# Patient Record
Sex: Female | Born: 1975 | Race: Black or African American | Hispanic: No | Marital: Married | Smoking: Never smoker
Health system: Southern US, Community
[De-identification: ages and names within clinical notes are randomized; demographics above are authoritative.]

## PROBLEM LIST (undated history)

## (undated) DIAGNOSIS — I10 Essential (primary) hypertension: Secondary | ICD-10-CM

---

## 1999-04-23 ENCOUNTER — Emergency Department (HOSPITAL_COMMUNITY): Admission: EM | Admit: 1999-04-23 | Discharge: 1999-04-23 | Payer: Self-pay | Admitting: Emergency Medicine

## 1999-04-23 ENCOUNTER — Encounter: Payer: Self-pay | Admitting: Emergency Medicine

## 2000-06-25 ENCOUNTER — Emergency Department (HOSPITAL_COMMUNITY): Admission: EM | Admit: 2000-06-25 | Discharge: 2000-06-26 | Payer: Self-pay | Admitting: Emergency Medicine

## 2000-06-26 ENCOUNTER — Encounter: Payer: Self-pay | Admitting: Emergency Medicine

## 2001-01-27 ENCOUNTER — Emergency Department (HOSPITAL_COMMUNITY): Admission: EM | Admit: 2001-01-27 | Discharge: 2001-01-27 | Payer: Self-pay | Admitting: Emergency Medicine

## 2001-02-08 ENCOUNTER — Emergency Department (HOSPITAL_COMMUNITY): Admission: EM | Admit: 2001-02-08 | Discharge: 2001-02-08 | Payer: Self-pay | Admitting: Emergency Medicine

## 2002-06-24 ENCOUNTER — Inpatient Hospital Stay (HOSPITAL_COMMUNITY): Admission: AD | Admit: 2002-06-24 | Discharge: 2002-06-24 | Payer: Self-pay | Admitting: Obstetrics and Gynecology

## 2002-09-05 ENCOUNTER — Emergency Department (HOSPITAL_COMMUNITY): Admission: EM | Admit: 2002-09-05 | Discharge: 2002-09-05 | Payer: Self-pay | Admitting: Emergency Medicine

## 2004-03-16 ENCOUNTER — Emergency Department (HOSPITAL_COMMUNITY): Admission: EM | Admit: 2004-03-16 | Discharge: 2004-03-16 | Payer: Self-pay | Admitting: Emergency Medicine

## 2004-03-20 ENCOUNTER — Inpatient Hospital Stay (HOSPITAL_COMMUNITY): Admission: AD | Admit: 2004-03-20 | Discharge: 2004-03-20 | Payer: Self-pay | Admitting: Obstetrics and Gynecology

## 2006-11-27 ENCOUNTER — Emergency Department (HOSPITAL_COMMUNITY): Admission: EM | Admit: 2006-11-27 | Discharge: 2006-11-27 | Payer: Self-pay | Admitting: Emergency Medicine

## 2007-06-02 ENCOUNTER — Emergency Department (HOSPITAL_COMMUNITY): Admission: EM | Admit: 2007-06-02 | Discharge: 2007-06-02 | Payer: Self-pay | Admitting: Emergency Medicine

## 2008-01-07 ENCOUNTER — Emergency Department (HOSPITAL_COMMUNITY): Admission: EM | Admit: 2008-01-07 | Discharge: 2008-01-07 | Payer: Self-pay | Admitting: Family Medicine

## 2008-08-31 ENCOUNTER — Emergency Department (HOSPITAL_COMMUNITY): Admission: EM | Admit: 2008-08-31 | Discharge: 2008-08-31 | Payer: Self-pay | Admitting: Emergency Medicine

## 2008-10-23 ENCOUNTER — Emergency Department (HOSPITAL_COMMUNITY): Admission: EM | Admit: 2008-10-23 | Discharge: 2008-10-24 | Payer: Self-pay | Admitting: Emergency Medicine

## 2008-11-24 ENCOUNTER — Emergency Department (HOSPITAL_COMMUNITY): Admission: EM | Admit: 2008-11-24 | Discharge: 2008-11-24 | Payer: Self-pay | Admitting: Family Medicine

## 2008-12-07 ENCOUNTER — Encounter: Admission: RE | Admit: 2008-12-07 | Discharge: 2008-12-07 | Payer: Self-pay | Admitting: Family Medicine

## 2009-02-28 ENCOUNTER — Emergency Department (HOSPITAL_COMMUNITY): Admission: EM | Admit: 2009-02-28 | Discharge: 2009-02-28 | Payer: Self-pay | Admitting: Emergency Medicine

## 2011-06-25 ENCOUNTER — Emergency Department (HOSPITAL_COMMUNITY)
Admission: EM | Admit: 2011-06-25 | Discharge: 2011-06-25 | Disposition: A | Discharge status: Home / Self Care | Payer: BC Managed Care – PPO | Attending: Emergency Medicine | Admitting: Emergency Medicine

## 2011-06-25 ENCOUNTER — Emergency Department (HOSPITAL_COMMUNITY): Payer: BC Managed Care – PPO

## 2011-06-25 DIAGNOSIS — R079 Chest pain, unspecified: Secondary | ICD-10-CM | POA: Insufficient documentation

## 2011-06-25 DIAGNOSIS — R0602 Shortness of breath: Secondary | ICD-10-CM | POA: Insufficient documentation

## 2011-06-25 DIAGNOSIS — R51 Headache: Secondary | ICD-10-CM | POA: Insufficient documentation

## 2011-06-25 DIAGNOSIS — R071 Chest pain on breathing: Secondary | ICD-10-CM | POA: Insufficient documentation

## 2011-06-25 DIAGNOSIS — H538 Other visual disturbances: Secondary | ICD-10-CM | POA: Insufficient documentation

## 2011-06-25 DIAGNOSIS — R209 Unspecified disturbances of skin sensation: Secondary | ICD-10-CM | POA: Insufficient documentation

## 2011-06-25 LAB — COMPREHENSIVE METABOLIC PANEL
ALT: 6 U/L (ref 0–35)
AST: 13 U/L (ref 0–37)
Calcium: 9.7 mg/dL (ref 8.4–10.5)
GFR calc Af Amer: >60 mL/min (ref >60)
Glucose, Bld: 96 mg/dL (ref 70–99)
Sodium: 139 mEq/L (ref 135–145)
Total Protein: 7.6 g/dL (ref 6.0–8.3)

## 2011-06-25 LAB — URINALYSIS, ROUTINE W REFLEX MICROSCOPIC
Leukocytes, UA: NEGATIVE
Specific Gravity, Urine: 1.026 (ref 1.005–1.030)
Urobilinogen, UA: 0.2 mg/dL (ref 0.0–1.0)

## 2011-06-25 LAB — URINE MICROSCOPIC-ADD ON

## 2011-06-25 LAB — CBC
MCHC: 34.4 g/dL (ref 30.0–36.0)
RDW: 12.6 % (ref 11.5–15.5)

## 2011-06-25 LAB — POCT PREGNANCY, URINE: Preg Test, Ur: NEGATIVE

## 2011-06-25 LAB — DIFFERENTIAL
Basophils Absolute: 0 10*3/uL (ref 0.0–0.1)
Basophils Relative: 0 % (ref 0–1)
Eosinophils Absolute: 0.2 10*3/uL (ref 0.0–0.7)
Eosinophils Relative: 3 % (ref 0–5)
Monocytes Absolute: 0.6 10*3/uL (ref 0.1–1.0)

## 2011-07-24 LAB — POCT CARDIAC MARKERS: Myoglobin, poc: 30.5

## 2011-07-24 LAB — POCT I-STAT, CHEM 8
Calcium, Ion: 1.16
Chloride: 108
HCT: 40
Hemoglobin: 13.6
TCO2: 25

## 2012-05-04 ENCOUNTER — Encounter (HOSPITAL_COMMUNITY): Payer: Self-pay | Admitting: *Deleted

## 2012-05-04 ENCOUNTER — Emergency Department (HOSPITAL_COMMUNITY)
Admission: EM | Admit: 2012-05-04 | Discharge: 2012-05-05 | Disposition: A | Discharge status: Home / Self Care | Payer: BC Managed Care – PPO | Attending: Emergency Medicine | Admitting: Emergency Medicine

## 2012-05-04 DIAGNOSIS — M79606 Pain in leg, unspecified: Secondary | ICD-10-CM

## 2012-05-04 DIAGNOSIS — M79609 Pain in unspecified limb: Secondary | ICD-10-CM | POA: Insufficient documentation

## 2012-05-04 NOTE — ED Notes (Signed)
Pt c/o left leg pain x1.5 weeks. Pt states pain was originally in calf but is now at inner thigh. Pt describes pain as sharp and constat but worse with movement/weight bearing.

## 2012-05-04 NOTE — ED Provider Notes (Signed)
History     CSN: [id number]  Arrival date & time 05/04/12  2305   First MD Initiated Contact with Patient 05/04/12 2332      Chief Complaint  Patient presents with  . Leg Pain    (Consider location/radiation/quality/duration/timing/severity/associated sxs/prior treatment) HPI Comments: Pt with history of approximately 10 days of left lower extremity pain on the medial calf just below the knee. It is intermittent, worse at night, not associated with shortness of breath. She noted that after getting home from church today the pain had migrated proximally and is now above the knee on the medial thigh. She feels like she can palpate tenderness in that area. She denies redness swelling fevers chills nausea or vomiting. She does feel as though she has been dehydrated and notes that her urine has been dark. There has been no dysuria, diarrhea, urinary frequenc. She has no risk factors for venous thromboembolism, has not had shortness of breath, travel, trauma, immobilization, history of cancer, hormone therapy use. There are no family members or personal history of VTE  Patient is a 36 y.o. female presenting with leg pain. The history is provided by the patient.  Leg Pain     History reviewed. No pertinent past medical history.  History reviewed. No pertinent past surgical history.  History reviewed. No pertinent family history.  History  Substance Use Topics  . Smoking status: Never Smoker   . Smokeless tobacco: Not on file  . Alcohol Use: No    OB History    Grav Para Term Preterm Abortions TAB SAB Ect Mult Living                  Review of Systems  All other systems reviewed and are negative.    Allergies  Penicillins  Home Medications   Current Outpatient Rx  Name Route Sig Dispense Refill  . NAPROXEN 500 MG PO TABS Oral Take 1 tablet (500 mg total) by mouth 2 (two) times daily with a meal. 30 tablet 0    BP 144/89  Pulse 92  Temp 97.5 F (36.4 C) (Oral)  Resp  20  SpO2 97%  LMP 01/24/2012  Physical Exam  Nursing note and vitals reviewed. Constitutional: She appears well-developed and well-nourished. No distress.  HENT:  Head: Normocephalic and atraumatic.  Mouth/Throat: Oropharynx is clear and moist. No oropharyngeal exudate.  Eyes: Conjunctivae and EOM are normal. Pupils are equal, round, and reactive to light. Right eye exhibits no discharge. Left eye exhibits no discharge. No scleral icterus.  Neck: Normal range of motion. Neck supple. No JVD present. No thyromegaly present.  Cardiovascular: Normal rate, regular rhythm, normal heart sounds and intact distal pulses.  Exam reveals no gallop and no friction rub.   No murmur heard.      Normal capillary refill and pulses at the feet bilaterally  Pulmonary/Chest: Effort normal and breath sounds normal. No respiratory distress. She has no wheezes. She has no rales.  Abdominal: Soft. Bowel sounds are normal. She exhibits no distension and no mass. There is no tenderness.  Musculoskeletal: Normal range of motion. She exhibits tenderness ( Left lower extremity with mild tenderness just proximal to the knee on the medial thigh. No masses palpated, negative Homans sign). She exhibits no edema.       No tenderness over the left lower extremity below the knee, no asymmetry, no edema  Lymphadenopathy:    She has no cervical adenopathy.  Neurological: She is alert. Coordination normal.  Skin: Skin  is warm and dry. No rash noted. No erythema.  Psychiatric: She has a normal mood and affect. Her behavior is normal.    ED Course  Procedures (including critical care time)  Labs Reviewed  BASIC METABOLIC PANEL - Abnormal; Notable for the following:    Glucose, Bld 115 (*)     GFR calc non Af Amer 73 (*)     GFR calc Af Amer 85 (*)     All other components within normal limits  URINALYSIS, ROUTINE W REFLEX MICROSCOPIC - Abnormal; Notable for the following:    Hgb urine dipstick TRACE (*)     All other  components within normal limits  URINE MICROSCOPIC-ADD ON - Abnormal; Notable for the following:    Squamous Epithelial / LPF FEW (*)     All other components within normal limits  D-DIMER, QUANTITATIVE  PREGNANCY, URINE   No results found.   1. Lower extremity pain       MDM   well appearing, vital signs normal, no tachycardia, fever, hypoxia or asymmetry of the legs. She is extremely low risk for DVT, d-dimer ordered, check potassium for hypokalemia, urinalysis as it has been dark. She is not nauseated and does not want an IV, will give oral fluids.  Laboratory work reviewed, urinalysis shows no significant dehydration, no ketones though she does have a high specific gravity. She is taking is without difficulty. Renal function is normal, potassium is normal and a d-dimer is negative. Findings discussed with the patient, home with Naprosyn.      Vida Roller, MD 05/05/12 [phone]

## 2012-05-04 NOTE — ED Notes (Signed)
Pt c/o medial calf pain x 2 weeks, tonight pt states pain in L medial thigh. Pt has no hx of DVT and few risk factors. Pt states worsening pain w/ ambulation. Pt denies injury.

## 2012-05-05 LAB — URINALYSIS, ROUTINE W REFLEX MICROSCOPIC
Bilirubin Urine: NEGATIVE
Glucose, UA: NEGATIVE mg/dL
Ketones, ur: NEGATIVE mg/dL
Protein, ur: NEGATIVE mg/dL
pH: 5.5 (ref 5.0–8.0)

## 2012-05-05 LAB — URINE MICROSCOPIC-ADD ON

## 2012-05-05 LAB — BASIC METABOLIC PANEL
BUN: 11 mg/dL (ref 6–23)
CO2: 26 mEq/L (ref 19–32)
Calcium: 9.5 mg/dL (ref 8.4–10.5)
Creatinine, Ser: 0.98 mg/dL (ref 0.50–1.10)
GFR calc non Af Amer: 73 mL/min — ABNORMAL LOW (ref >90)
Glucose, Bld: 115 mg/dL — ABNORMAL HIGH (ref 70–99)
Sodium: 137 mEq/L (ref 135–145)

## 2012-05-05 MED ORDER — NAPROXEN 500 MG PO TABS: 500.0000 mg | ORAL_TABLET | Freq: Two times a day (BID) | ORAL | Status: AC

## 2016-06-12 DIAGNOSIS — E282 Polycystic ovarian syndrome: Secondary | ICD-10-CM | POA: Insufficient documentation

## 2017-05-30 ENCOUNTER — Encounter (INDEPENDENT_AMBULATORY_CARE_PROVIDER_SITE_OTHER): Payer: Self-pay | Admitting: Ophthalmology

## 2018-07-27 ENCOUNTER — Other Ambulatory Visit: Payer: Self-pay

## 2018-07-27 ENCOUNTER — Emergency Department (HOSPITAL_COMMUNITY)
Admission: EM | Admit: 2018-07-27 | Discharge: 2018-07-28 | Disposition: A | Discharge status: Home / Self Care | Payer: Self-pay | Attending: Emergency Medicine | Admitting: Emergency Medicine

## 2018-07-27 ENCOUNTER — Emergency Department (HOSPITAL_COMMUNITY): Payer: Self-pay

## 2018-07-27 ENCOUNTER — Encounter (HOSPITAL_COMMUNITY): Payer: Self-pay | Admitting: Obstetrics and Gynecology

## 2018-07-27 DIAGNOSIS — I10 Essential (primary) hypertension: Secondary | ICD-10-CM | POA: Insufficient documentation

## 2018-07-27 DIAGNOSIS — R1011 Right upper quadrant pain: Secondary | ICD-10-CM | POA: Insufficient documentation

## 2018-07-27 HISTORY — DX: Essential (primary) hypertension: I10

## 2018-07-27 LAB — COMPREHENSIVE METABOLIC PANEL
ALBUMIN: 4 g/dL (ref 3.5–5.0)
ALT: 7 U/L (ref 0–44)
AST: 14 U/L — AB (ref 15–41)
Alkaline Phosphatase: 79 U/L (ref 38–126)
Anion gap: 11 (ref 5–15)
BUN: 19 mg/dL (ref 6–20)
CHLORIDE: 103 mmol/L (ref 98–111)
CO2: 27 mmol/L (ref 22–32)
Calcium: 9.8 mg/dL (ref 8.9–10.3)
Creatinine, Ser: 0.87 mg/dL (ref 0.44–1.00)
GFR calc Af Amer: >60 mL/min (ref >60)
GFR calc non Af Amer: >60 mL/min (ref >60)
Glucose, Bld: 106 mg/dL — ABNORMAL HIGH (ref 70–99)
POTASSIUM: 3.7 mmol/L (ref 3.5–5.1)
SODIUM: 141 mmol/L (ref 135–145)
Total Bilirubin: 0.5 mg/dL (ref 0.3–1.2)
Total Protein: 7.6 g/dL (ref 6.5–8.1)

## 2018-07-27 LAB — CBC
HCT: 41.7 % (ref 36.0–46.0)
Hemoglobin: 14.3 g/dL (ref 12.0–15.0)
MCH: 32.3 pg (ref 26.0–34.0)
MCHC: 34.3 g/dL (ref 30.0–36.0)
MCV: 94.1 fL (ref 78.0–100.0)
PLATELETS: 245 10*3/uL (ref 150–400)
RBC: 4.43 MIL/uL (ref 3.87–5.11)
RDW: 12.2 % (ref 11.5–15.5)
WBC: 8.3 10*3/uL (ref 4.0–10.5)

## 2018-07-27 LAB — I-STAT BETA HCG BLOOD, ED (MC, WL, AP ONLY): I-stat hCG, quantitative: <5 m[IU]/mL (ref <5)

## 2018-07-27 LAB — LIPASE, BLOOD: Lipase: 40 U/L (ref 11–51)

## 2018-07-27 MED ORDER — OXYCODONE HCL 5 MG PO TABS
5.0000 mg | ORAL_TABLET | Freq: Once | ORAL | Status: AC
  Administered 2018-07-28: 5 mg via ORAL
  Filled 2018-07-27: qty 1

## 2018-07-27 MED ORDER — OXYCODONE-ACETAMINOPHEN 5-325 MG PO TABS: 2.0000 | ORAL_TABLET | ORAL | 0 refills | Status: AC | PRN

## 2018-07-27 NOTE — ED Provider Notes (Addendum)
[facility] Emergency Department Provider Note MRN:  [id number]  Arrival date & time: 07/27/18     Chief Complaint   Abdominal Pain   History of Present Illness   Christina Delacruz is a 42 y.o. year-old female with a history of HTN presenting to the ED with chief complaint of abdominal pain.  2 months of intermittent RUQ pain.  Dull, moderate in severity.  Worse today.  Nausea but no vomiting.  No lower abdominal pain.  No vaginal bleeding or discharge.  No chest pain or shortness of breath.  No history of GERD, no relation to meals.  Review of Systems  A complete 10 system review of systems was obtained and all systems are negative except as noted in the HPI and PMH.   Patient's Health History    Past Medical History:  Diagnosis Date  . Hypertension     History reviewed. No pertinent surgical history.  No family history on file.  Social History   Socioeconomic History  . Marital status: Single    Spouse name: Not on file  . Number of children: Not on file  . Years of education: Not on file  . Highest education level: Not on file  Occupational History  . Not on file  Social Needs  . Financial resource strain: Not on file  . Food insecurity:    Worry: Not on file    Inability: Not on file  . Transportation needs:    Medical: Not on file    Non-medical: Not on file  Tobacco Use  . Smoking status: Never Smoker  . Smokeless tobacco: Never Used  Substance and Sexual Activity  . Alcohol use: No  . Drug use: No  . Sexual activity: Yes    Birth control/protection: None  Lifestyle  . Physical activity:    Days per week: Not on file    Minutes per session: Not on file  . Stress: Not on file  Relationships  . Social connections:    Talks on phone: Not on file    Gets together: Not on file    Attends religious service: Not on file    Active member of club or organization: Not on file    Attends meetings of clubs or organizations: Not on file   Relationship status: Not on file  . Intimate partner violence:    Fear of current or ex partner: Not on file    Emotionally abused: Not on file    Physically abused: Not on file    Forced sexual activity: Not on file  Other Topics Concern  . Not on file  Social History Narrative  . Not on file     Physical Exam  Vital Signs and Nursing Notes reviewed Vitals:   07/27/18 2130  BP: (!) 146/97  Pulse: 92  Resp: 18  Temp: 98.5 F (36.9 C)  SpO2: 100%    CONSTITUTIONAL: Well-appearing, NAD NEURO:  Alert and oriented x 3, no focal deficits EYES:  eyes equal and reactive ENT/NECK:  no LAD, no JVD CARDIO: Regular rate, well-perfused, normal S1 and S2 PULM:  CTAB no wheezing or rhonchi GI/GU:  normal bowel sounds, non-distended, mild to moderate tenderness palpation to the right upper quadrant MSK/SPINE:  No gross deformities, no edema SKIN:  no rash, atraumatic PSYCH:  Appropriate speech and behavior  Diagnostic and Interventional Summary    EKG Interpretation  Date/Time:    Ventricular Rate:    PR Interval:    QRS Duration:  QT Interval:    QTC Calculation:   R Axis:     Text Interpretation:        Labs Reviewed  COMPREHENSIVE METABOLIC PANEL - Abnormal; Notable for the following components:      Result Value   Glucose, Bld 106 (*)    AST 14 (*)    All other components within normal limits  CBC  LIPASE, BLOOD  I-STAT BETA HCG BLOOD, ED ([facility], [facility], [facility] ONLY)    US Abdomen Limited RUQ  Final Result      Medications  oxyCODONE (Oxy IR/ROXICODONE) immediate release tablet 5 mg (0 mg Oral Hold 07/27/18 2225)     Procedures Critical Care  ED Course and Medical Decision Making  I have reviewed the triage vital signs and the nursing notes.  Pertinent labs & imaging results that were available during my care of the patient were reviewed by me and considered in my medical decision making (see below for details).  Favoring biliary colic in this 42 year old female  with intermittent right upper quadrant pain.  Ultrasound pending.  Anticipating biliary colic, discharge with general surgery referral.  Ultrasound reveals no cholecystitis, no stones, mild ectasia of the common bile duct.  Patient feeling better, continues to be well-appearing with stable vital signs.  Labs are very reassuring, normal LFTs, normal T bili.  Nothing to suggest an emergent condition currently such as ascending cholangitis.  Patient agreeable to outpatient management, given number to [facility], will call in the morning.  Stressed the importance of returning to the ED with fever or worsening of symptoms.    After the discussed management above, the patient was determined to be safe for discharge.  The patient was in agreement with this plan and all questions regarding their care were answered.  ED return precautions were discussed and the patient will return to the ED with any significant worsening of condition.  Elmer Sow. Pilar Plate, MD [facility] Emergency Medicine [facility] [email or web address]  Final Clinical Impressions(s) / ED Diagnoses     ICD-10-CM   1. RUQ abdominal pain R10.11 US Abdomen Limited RUQ    US Abdomen Limited RUQ    ED Discharge Orders         Ordered    oxyCODONE-acetaminophen (PERCOCET/ROXICET) 5-325 MG tablet  Every 4 hours PRN     07/27/18 2350            Sabas Sous, MD 07/27/18 2330    Sabas Sous, MD 07/27/18 2355

## 2018-07-27 NOTE — Discharge Instructions (Signed)
You were evaluated in the Emergency Department and after careful evaluation, we did not find any emergent condition requiring admission or further testing in the hospital.  Your symptoms today may be related to dilation of your common bile duct.  Your labs today were very reassuring.  Please call the gastroenterology office tomorrow morning to schedule appointment.  Please return to the Emergency Department if you experience any worsening of your condition or fever.  We encourage you to follow up with a primary care provider.  Thank you for allowing [place] to be a part of your care.

## 2018-07-27 NOTE — ED Triage Notes (Signed)
Pt reports pain in her RUQ. Pt reports intermittent nausea, but no emesis. Pt denies any bowel abnormalities. Pt reports last BM today that was harder than normal. Pt reports her appetite recently changed.

## 2018-12-16 ENCOUNTER — Other Ambulatory Visit (HOSPITAL_COMMUNITY): Payer: Self-pay | Admitting: Gastroenterology

## 2018-12-16 ENCOUNTER — Other Ambulatory Visit: Payer: Self-pay | Admitting: Gastroenterology

## 2018-12-16 DIAGNOSIS — R935 Abnormal findings on diagnostic imaging of other abdominal regions, including retroperitoneum: Secondary | ICD-10-CM

## 2018-12-16 DIAGNOSIS — R1011 Right upper quadrant pain: Secondary | ICD-10-CM

## 2018-12-29 ENCOUNTER — Ambulatory Visit (HOSPITAL_COMMUNITY)
Admission: RE | Admit: 2018-12-29 | Discharge: 2018-12-29 | Disposition: A | Discharge status: Home / Self Care | Payer: BC Managed Care – PPO | Source: Ambulatory Visit | Attending: Gastroenterology | Admitting: Gastroenterology

## 2018-12-29 DIAGNOSIS — R935 Abnormal findings on diagnostic imaging of other abdominal regions, including retroperitoneum: Secondary | ICD-10-CM | POA: Diagnosis present

## 2018-12-29 DIAGNOSIS — R1011 Right upper quadrant pain: Secondary | ICD-10-CM | POA: Insufficient documentation

## 2019-03-31 ENCOUNTER — Other Ambulatory Visit: Payer: Self-pay | Admitting: *Deleted

## 2019-03-31 DIAGNOSIS — Z20822 Contact with and (suspected) exposure to covid-19: Secondary | ICD-10-CM

## 2019-04-03 LAB — NOVEL CORONAVIRUS, NAA: SARS-CoV-2, NAA: NOT DETECTED

## 2019-05-19 ENCOUNTER — Other Ambulatory Visit: Payer: Self-pay | Admitting: *Deleted

## 2019-05-19 DIAGNOSIS — Z20822 Contact with and (suspected) exposure to covid-19: Secondary | ICD-10-CM

## 2019-05-19 NOTE — Progress Notes (Unsigned)
la 

## 2019-05-20 ENCOUNTER — Other Ambulatory Visit: Payer: Self-pay

## 2019-05-20 DIAGNOSIS — Z20822 Contact with and (suspected) exposure to covid-19: Secondary | ICD-10-CM

## 2019-05-21 LAB — NOVEL CORONAVIRUS, NAA: SARS-CoV-2, NAA: NOT DETECTED

## 2019-06-03 ENCOUNTER — Other Ambulatory Visit: Payer: Self-pay

## 2019-06-03 DIAGNOSIS — Z20822 Contact with and (suspected) exposure to covid-19: Secondary | ICD-10-CM

## 2019-06-04 LAB — NOVEL CORONAVIRUS, NAA: SARS-CoV-2, NAA: NOT DETECTED

## 2019-06-09 ENCOUNTER — Telehealth: Payer: Self-pay

## 2019-06-09 NOTE — Telephone Encounter (Signed)
Pt. Called back, given COVID 19 results. 

## 2019-08-15 ENCOUNTER — Other Ambulatory Visit: Payer: Self-pay

## 2019-08-15 DIAGNOSIS — Z20822 Contact with and (suspected) exposure to covid-19: Secondary | ICD-10-CM

## 2019-08-16 LAB — NOVEL CORONAVIRUS, NAA: SARS-CoV-2, NAA: NOT DETECTED

## 2019-09-12 ENCOUNTER — Other Ambulatory Visit: Payer: Self-pay

## 2019-09-12 DIAGNOSIS — Z20822 Contact with and (suspected) exposure to covid-19: Secondary | ICD-10-CM

## 2019-09-14 LAB — NOVEL CORONAVIRUS, NAA: SARS-CoV-2, NAA: NOT DETECTED

## 2019-10-03 ENCOUNTER — Other Ambulatory Visit: Payer: Self-pay

## 2019-10-03 DIAGNOSIS — Z20822 Contact with and (suspected) exposure to covid-19: Secondary | ICD-10-CM

## 2019-10-04 LAB — NOVEL CORONAVIRUS, NAA: SARS-CoV-2, NAA: NOT DETECTED

## 2019-10-15 ENCOUNTER — Encounter (HOSPITAL_COMMUNITY): Payer: Self-pay

## 2019-10-15 ENCOUNTER — Other Ambulatory Visit: Payer: Self-pay

## 2019-10-15 DIAGNOSIS — R079 Chest pain, unspecified: Secondary | ICD-10-CM | POA: Insufficient documentation

## 2019-10-15 DIAGNOSIS — I10 Essential (primary) hypertension: Secondary | ICD-10-CM | POA: Insufficient documentation

## 2019-10-15 DIAGNOSIS — R0600 Dyspnea, unspecified: Secondary | ICD-10-CM | POA: Insufficient documentation

## 2019-10-15 MED ORDER — SODIUM CHLORIDE 0.9% FLUSH: 3.0000 mL | Freq: Once | INTRAVENOUS | Status: DC

## 2019-10-15 NOTE — ED Triage Notes (Signed)
Pt coming from home c/o chest pain x1 week that comes and goes. Endorses N, dizziness and a headache. 7/10. Feels tight. Hx of HTN but takes medication. Worse at night

## 2019-10-16 ENCOUNTER — Emergency Department (HOSPITAL_COMMUNITY)
Admission: EM | Admit: 2019-10-16 | Discharge: 2019-10-16 | Disposition: A | Discharge status: Home / Self Care | Payer: Self-pay | Attending: Emergency Medicine | Admitting: Emergency Medicine

## 2019-10-16 ENCOUNTER — Emergency Department (HOSPITAL_COMMUNITY): Payer: Self-pay

## 2019-10-16 DIAGNOSIS — R0600 Dyspnea, unspecified: Secondary | ICD-10-CM

## 2019-10-16 DIAGNOSIS — R030 Elevated blood-pressure reading, without diagnosis of hypertension: Secondary | ICD-10-CM

## 2019-10-16 DIAGNOSIS — R079 Chest pain, unspecified: Secondary | ICD-10-CM

## 2019-10-16 LAB — CBC
HCT: 40.6 % (ref 36.0–46.0)
Hemoglobin: 13.3 g/dL (ref 12.0–15.0)
MCH: 31.9 pg (ref 26.0–34.0)
MCHC: 32.8 g/dL (ref 30.0–36.0)
MCV: 97.4 fL (ref 80.0–100.0)
Platelets: 257 10*3/uL (ref 150–400)
RBC: 4.17 MIL/uL (ref 3.87–5.11)
RDW: 11.8 % (ref 11.5–15.5)
WBC: 6 10*3/uL (ref 4.0–10.5)
nRBC: 0 % (ref 0.0–0.2)

## 2019-10-16 LAB — BASIC METABOLIC PANEL
Anion gap: 9 (ref 5–15)
BUN: 11 mg/dL (ref 6–20)
CO2: 26 mmol/L (ref 22–32)
Calcium: 8.7 mg/dL — ABNORMAL LOW (ref 8.9–10.3)
Chloride: 102 mmol/L (ref 98–111)
Creatinine, Ser: 1.03 mg/dL — ABNORMAL HIGH (ref 0.44–1.00)
GFR calc Af Amer: >60 mL/min (ref >60)
GFR calc non Af Amer: >60 mL/min (ref >60)
Glucose, Bld: 94 mg/dL (ref 70–99)
Potassium: 3.8 mmol/L (ref 3.5–5.1)
Sodium: 137 mmol/L (ref 135–145)

## 2019-10-16 LAB — I-STAT BETA HCG BLOOD, ED (NOT ORDERABLE): I-stat hCG, quantitative: <5 m[IU]/mL (ref <5)

## 2019-10-16 LAB — BRAIN NATRIURETIC PEPTIDE: B Natriuretic Peptide: 9.7 pg/mL (ref 0.0–100.0)

## 2019-10-16 LAB — TROPONIN I (HIGH SENSITIVITY): Troponin I (High Sensitivity): <2 ng/L (ref <18)

## 2019-10-16 NOTE — ED Provider Notes (Signed)
[facility] DEPT Provider Note   CSN: [id number] Arrival date & time: 10/15/19  2338     History Chief Complaint  Patient presents with  . Chest Pain    Christina Delacruz is a 43 y.o. female.  HPI  HPI: A 43 year old patient with a history of hypertension and obesity presents for evaluation of chest pain. Initial onset of pain was approximately 3-6 hours ago. The patient's chest pain is described as heaviness/pressure/tightness and is worse with exertion. The patient complains of nausea. The patient's chest pain is middle- or left-sided, is not well-localized, is not sharp and does not radiate to the arms/jaw/neck. The patient denies diaphoresis. The patient has no history of stroke, has no history of peripheral artery disease, has not smoked in the past 90 days, denies any history of treated diabetes, has no relevant family history of coronary artery disease (first degree relative at less than age 23) and has no history of hypercholesterolemia.   Patient reports that over the past few days she has been having shortness of breath.  Shortness of breath typically comes about at nighttime when she is asleep.  She intermittently has associated chest pain.  When patient has chest pain it is midsternal and nonradiating.  There is no history of GERD.  Pt has no hx of PE, DVT and denies any exogenous hormone (testosterone / estrogen) use, long distance travels or surgery in the past 6 weeks, active cancer, recent immobilization.   Past Medical History:  Diagnosis Date  . Hypertension     There are no problems to display for this patient.   History reviewed. No pertinent surgical history.   OB History    Gravida      Para      Term      Preterm      AB      Living  0     SAB      TAB      Ectopic      Multiple      Live Births              No family history on file.  Social History   Tobacco Use  . Smoking status: Never Smoker    . Smokeless tobacco: Never Used  Substance Use Topics  . Alcohol use: No  . Drug use: No    Home Medications Prior to Admission medications   Medication Sig Start Date End Date Taking? Authorizing Provider  oxyCODONE-acetaminophen (PERCOCET/ROXICET) 5-325 MG tablet Take 2 tablets by mouth every 4 (four) hours as needed for severe pain. 07/27/18   Maudie Flakes, MD    Allergies    Penicillins  Review of Systems   Review of Systems  Constitutional: Positive for activity change.  Respiratory: Positive for chest tightness and shortness of breath.   Cardiovascular: Positive for chest pain.  Gastrointestinal: Negative for nausea and vomiting.  Neurological: Positive for dizziness. Negative for syncope.  All other systems reviewed and are negative.   Physical Exam Updated Vital Signs BP (!) 141/96   Pulse 84   Temp 98.1 F (36.7 C) (Oral)   Resp 15   Ht 5\' 5"  (1.651 m)   Wt 104.3 kg   LMP 10/09/2019   SpO2 99%   BMI 38.27 kg/m   Physical Exam Vitals and nursing note reviewed.  Constitutional:      Appearance: She is well-developed.  HENT:     Head: Normocephalic and atraumatic.  Eyes:  Pupils: Pupils are equal, round, and reactive to light.  Cardiovascular:     Rate and Rhythm: Normal rate and regular rhythm.     Heart sounds: Normal heart sounds. No murmur.  Pulmonary:     Effort: Pulmonary effort is normal. No respiratory distress.  Abdominal:     General: There is no distension.     Palpations: Abdomen is soft.     Tenderness: There is no abdominal tenderness. There is no guarding or rebound.  Musculoskeletal:     Cervical back: Neck supple.     Right lower leg: No edema.     Left lower leg: No edema.  Skin:    General: Skin is warm and dry.  Neurological:     Mental Status: She is alert and oriented to person, place, and time.     ED Results / Procedures / Treatments   Labs (all labs ordered are listed, but only abnormal results are  displayed) Labs Reviewed  BASIC METABOLIC PANEL - Abnormal; Notable for the following components:      Result Value   Creatinine, Ser 1.03 (*)    Calcium 8.7 (*)    All other components within normal limits  CBC  BRAIN NATRIURETIC PEPTIDE  I-STAT BETA HCG BLOOD, ED ([facility], [facility], [facility] ONLY)  I-STAT BETA HCG BLOOD, ED (NOT ORDERABLE)  TROPONIN I (HIGH SENSITIVITY)  TROPONIN I (HIGH SENSITIVITY)    EKG EKG Interpretation  Date/Time:  Thursday October 15 2019 23:53:44 EST Ventricular Rate:  94 PR Interval:    QRS Duration: 87 QT Interval:  367 QTC Calculation: 459 R Axis:   70 Text Interpretation: Sinus rhythm No acute changes No significant change since last tracing Confirmed by Derwood Kaplan [phone]) on 10/16/2019 12:55:06 AM   Radiology DG Chest 2 View  Result Date: 10/16/2019 CLINICAL DATA:  Chest pain, nausea, dizziness and headache EXAM: CHEST - 2 VIEW COMPARISON:  Radiograph 07/15/2011 FINDINGS: No consolidation, features of edema, pneumothorax, or effusion. Pulmonary vascularity is normally distributed. The cardiomediastinal contours are unremarkable. No acute osseous or soft tissue abnormality. IMPRESSION: No acute cardiopulmonary abnormality. Electronically Signed   By: Kreg Shropshire M.D.   On: 10/16/2019 00:33    Procedures Procedures (including critical care time)  Medications Ordered in ED Medications  sodium chloride flush (NS) 0.9 % injection 3 mL (has no administration in time range)    ED Course  I have reviewed the triage vital signs and the nursing notes.  Pertinent labs & imaging results that were available during my care of the patient were reviewed by me and considered in my medical decision making (see chart for details).    MDM Rules/Calculators/A&P HEAR Score: 3                    Differential diagnosis includes: ACS syndrome Aortic dissection Myocarditis Pericarditis Pericardial effusion / tamponade Pneumonia PE Musculoskeletal pain PUD /  Gastritis / Esophagitis Esophageal spasm  43 year old female with history of hypertension comes in with nonspecific chest pain or shortness of breath.  The 2 are present together sometimes, at other times they are present separately.  Symptoms are worse with exertion and with her laying flat.  CHF is high on the differential although pulmonary exam and lower extremity exam is benign.  BNP is ordered.  ACS is low on the differential diagnosis.  High-sensitivity troponin is negative her hear score is less than 4 and her last episode of chest pain was more than 3 hours ago,  therefore we do not need a repeat troponin.  Chest x-ray is negative.  EKG is reassuring and patient has 0 on Wells score and is PERC negative.  We do not think she has PE but have discussed return precautions for it.  Also she complains of elevated blood pressure.  We have advised her to keep a log of her BP and follow-up with her PCP in regards to it.  Final Clinical Impression(s) / ED Diagnoses Final diagnoses:  Nonspecific chest pain  Dyspnea, unspecified type  Elevated blood pressure reading    Rx / DC Orders ED Discharge Orders    None       Derwood KaplanNanavati, Josephmichael Lisenbee, MD 10/16/19 0300

## 2019-10-16 NOTE — Discharge Instructions (Addendum)
We saw you in the ER for the chest pain/shortness of breath. All of our cardiac workup is normal, including labs, EKG and chest X-RAY are normal. There is no evidence of heart attack, heart failure or infection in your lung.  We are not sure what is causing your discomfort, but we feel comfortable sending you home at this time. The workup in the ER is not complete, and you should follow up with your primary care doctor for further evaluation.  Please return to the ER if you have worsening chest pain, shortness of breath, pain radiating to your jaw, shoulder, or back, sweats or fainting. Otherwise see the Cardiologist or your primary care doctor as requested.

## 2019-11-05 ENCOUNTER — Other Ambulatory Visit: Payer: Self-pay | Admitting: Cardiology

## 2019-11-05 DIAGNOSIS — Z20822 Contact with and (suspected) exposure to covid-19: Secondary | ICD-10-CM

## 2019-11-06 LAB — NOVEL CORONAVIRUS, NAA: SARS-CoV-2, NAA: NOT DETECTED

## 2019-11-26 ENCOUNTER — Other Ambulatory Visit: Payer: Self-pay

## 2019-11-26 DIAGNOSIS — Z20822 Contact with and (suspected) exposure to covid-19: Secondary | ICD-10-CM

## 2019-11-27 LAB — NOVEL CORONAVIRUS, NAA: SARS-CoV-2, NAA: NOT DETECTED

## 2019-12-19 ENCOUNTER — Other Ambulatory Visit: Payer: Self-pay

## 2019-12-19 DIAGNOSIS — Z20822 Contact with and (suspected) exposure to covid-19: Secondary | ICD-10-CM

## 2019-12-21 LAB — NOVEL CORONAVIRUS, NAA: SARS-CoV-2, NAA: NOT DETECTED

## 2020-10-10 DIAGNOSIS — M1711 Unilateral primary osteoarthritis, right knee: Secondary | ICD-10-CM | POA: Insufficient documentation

## 2020-10-19 ENCOUNTER — Other Ambulatory Visit (HOSPITAL_COMMUNITY): Payer: Self-pay | Admitting: Gastroenterology

## 2020-10-19 ENCOUNTER — Other Ambulatory Visit: Payer: Self-pay | Admitting: Gastroenterology

## 2020-10-19 DIAGNOSIS — K862 Cyst of pancreas: Secondary | ICD-10-CM

## 2020-11-04 ENCOUNTER — Ambulatory Visit (HOSPITAL_COMMUNITY)
Admission: RE | Admit: 2020-11-04 | Discharge: 2020-11-04 | Disposition: A | Discharge status: Home / Self Care | Payer: 59 | Source: Ambulatory Visit | Attending: Gastroenterology | Admitting: Gastroenterology

## 2020-11-04 ENCOUNTER — Other Ambulatory Visit (HOSPITAL_COMMUNITY): Payer: Self-pay | Admitting: Gastroenterology

## 2020-11-04 ENCOUNTER — Other Ambulatory Visit: Payer: Self-pay

## 2020-11-04 DIAGNOSIS — K862 Cyst of pancreas: Secondary | ICD-10-CM | POA: Insufficient documentation

## 2021-01-15 IMAGING — MR MR MRCP
10 of 12 series · 38 of 48 positions shown · non-contrast
Comparison: Abdominal MRI 12/29/2018. Abdominal ultrasound
07/28/2018.

CLINICAL DATA: Follow-up pancreatic cyst.  No current complaints.

EXAM:
MRI ABDOMEN WITHOUT CONTRAST  (INCLUDING MRCP)
TECHNIQUE: Multiplanar multisequence MR imaging of the abdomen was performed.
Heavily T2-weighted images of the biliary and pancreatic ducts were
obtained, and three-dimensional MRCP images were rendered by post
processing.

[Series 3: T2 fat-sat · axial · 6.0mm · 1.09mm/px · z∈[-78,+174]mm · 3 of 36 slices shown]
[im 1/36]
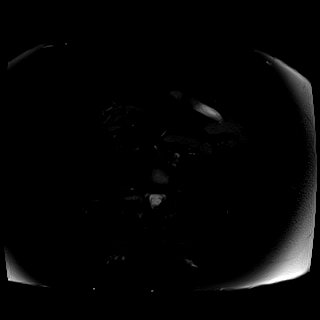
[im 18/36]
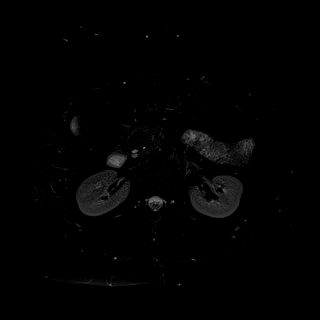
[im 36/36]
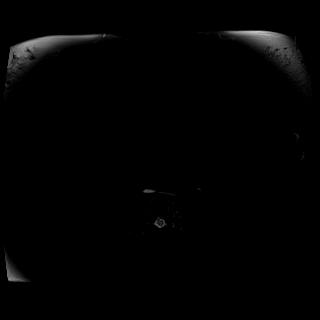

[Series 5: T2 · coronal · 6.0mm · 1.37mm/px · 3 of 33 slices shown (1 of 2)]
[im 1/33]
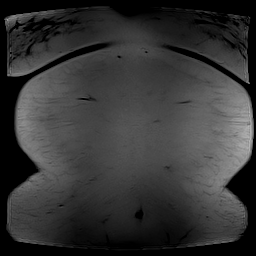
[im 17/33]
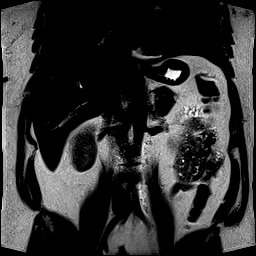
[im 33/33]
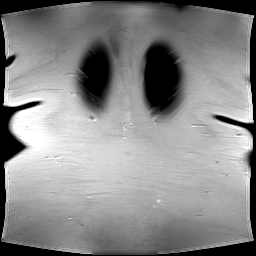

[Series 6: DWI · axial · 6.0mm · 1.37mm/px · z∈[-63,+174]mm · 5 of 68 slices shown (1 of 2)]
[im 1/68]
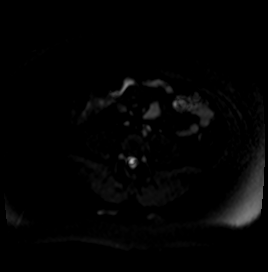
[im 17/68]
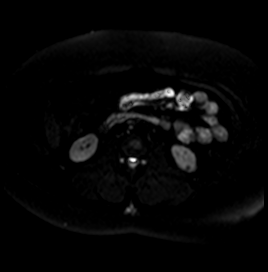
[im 34/68]
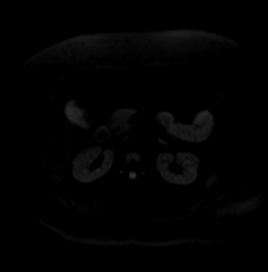
[im 51/68]
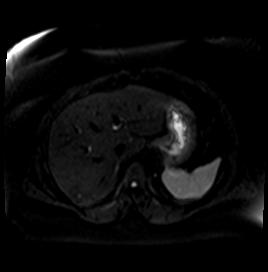
[im 68/68]
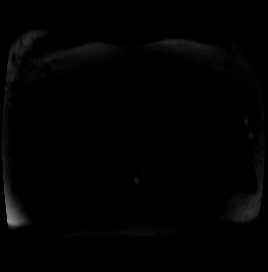

[Series 7: DWI · axial · 6.0mm · 1.37mm/px · z∈[-63,+174]mm · 3 of 34 slices shown (2 of 2)]
[im 1/34]
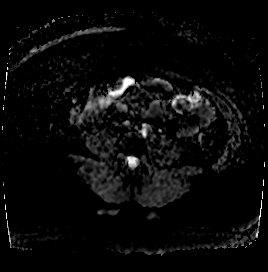
[im 17/34]
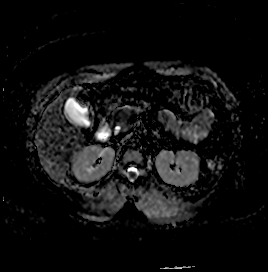
[im 34/34]
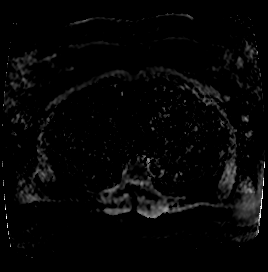

[Series 8: T1 · axial · 3.0mm · 1.09mm/px · z∈[-68,+169]mm · 6 of 80 slices shown (1 of 2)]
[im 1/80]
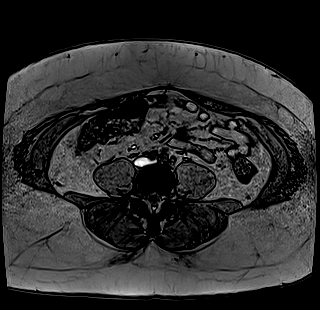
[im 16/80]
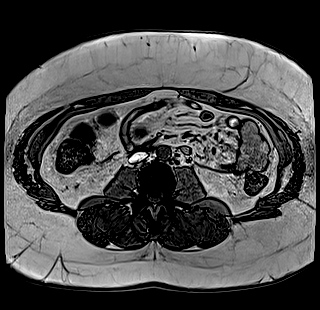
[im 32/80]
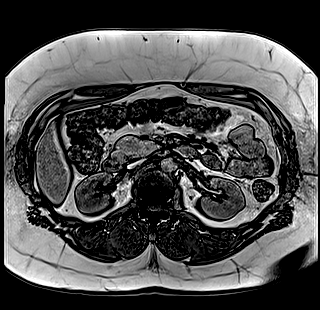
[im 48/80]
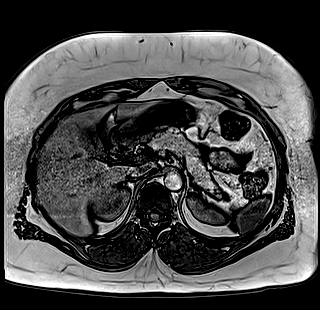
[im 64/80]
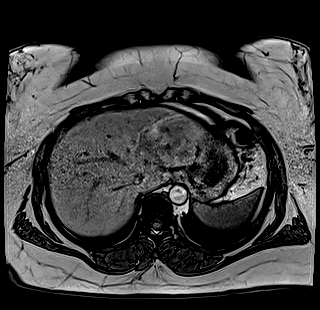
[im 80/80]
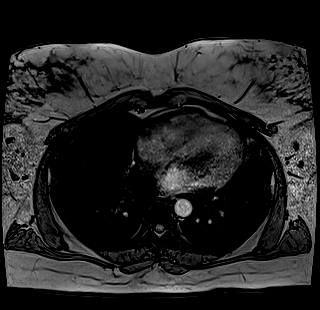

[Series 9: T1 · axial · 3.0mm · 1.09mm/px · z∈[-68,+169]mm · 6 of 80 slices shown (2 of 2)]
[im 1/80]
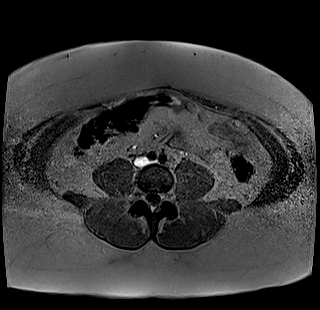
[im 16/80]
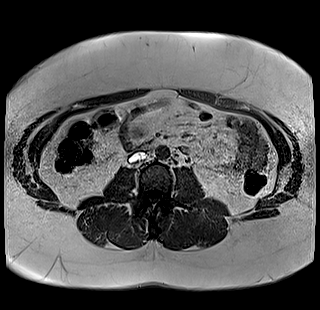
[im 32/80]
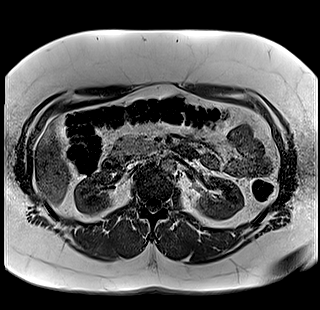
[im 48/80]
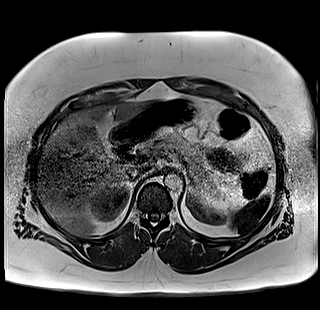
[im 64/80]
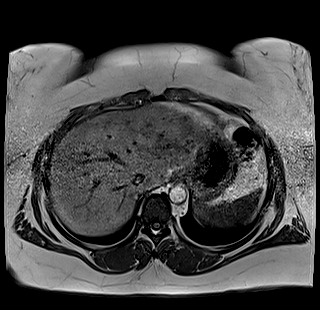
[im 80/80]
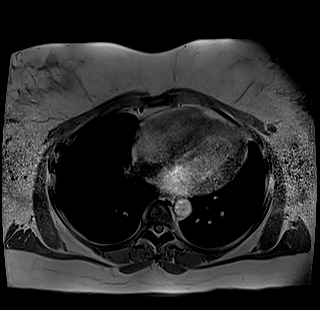

[Series 10: T2 · axial · 6.0mm · 1.37mm/px · z∈[-55,+161]mm · 2 of 31 slices shown (2 of 2)]
[im 1/31]
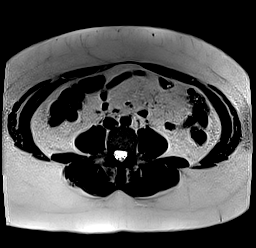
[im 31/31]
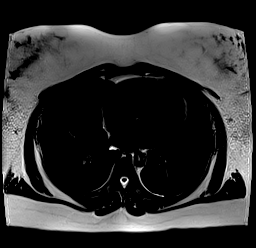

[Series 12: T1 dynamic · axial · 3.0mm · 1.09mm/px · z∈[-90,+171]mm · 7 of 88 slices shown]
[im 1/88]
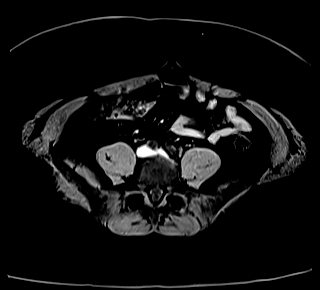
[im 15/88]
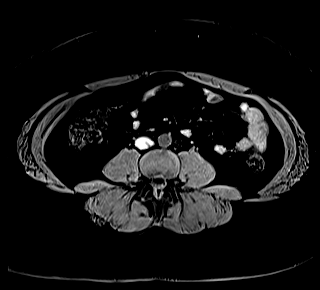
[im 30/88]
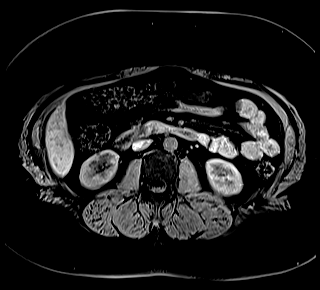
[im 44/88]
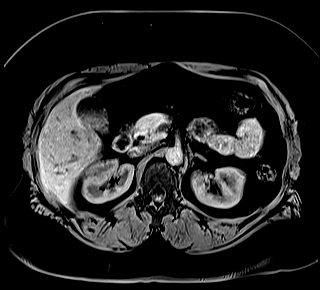
[im 59/88]
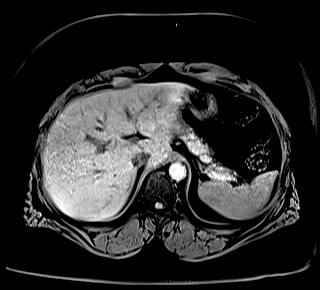
[im 73/88]
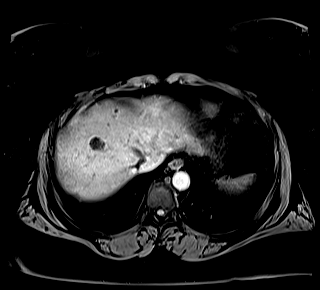
[im 88/88]
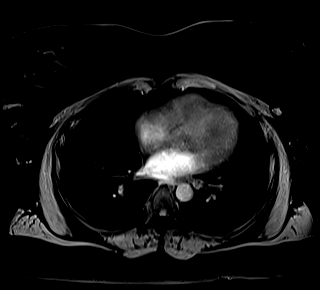

[Series 14: cor obl thk · sagittal · 50.0mm · 0.78mm/px · 1 of 9 slices shown]
[im 1/9]
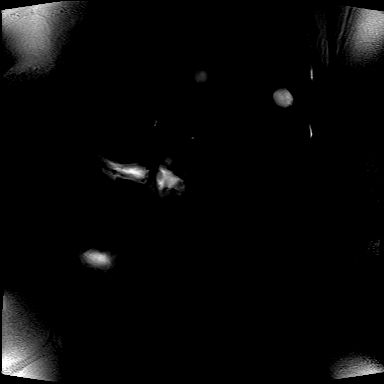

[Series 16: cor_3d_spc_trig · coronal · 1.0mm · 0.46mm/px · 2 of 80 slices shown]
[im 1/80]
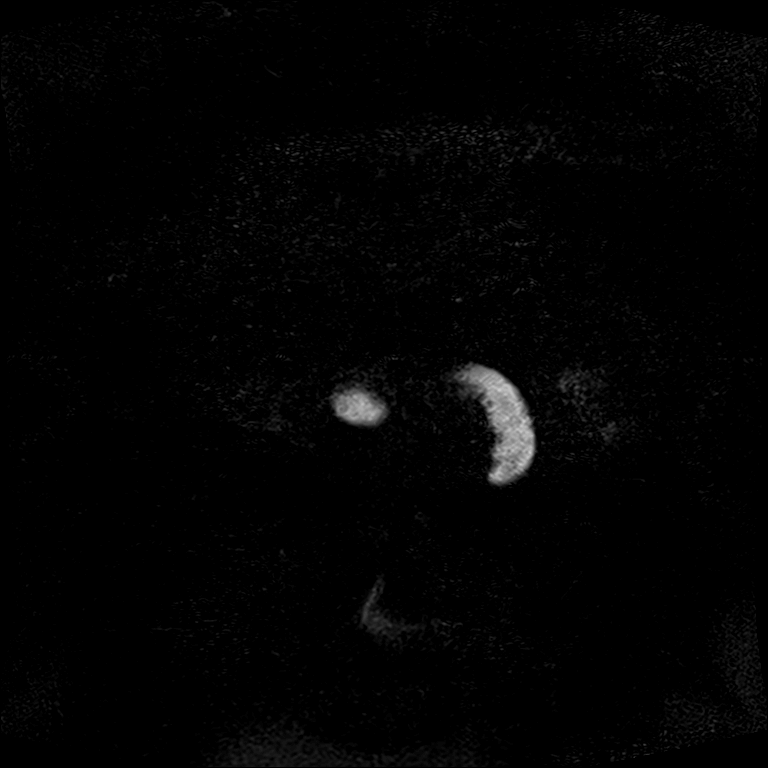
[im 16/80]
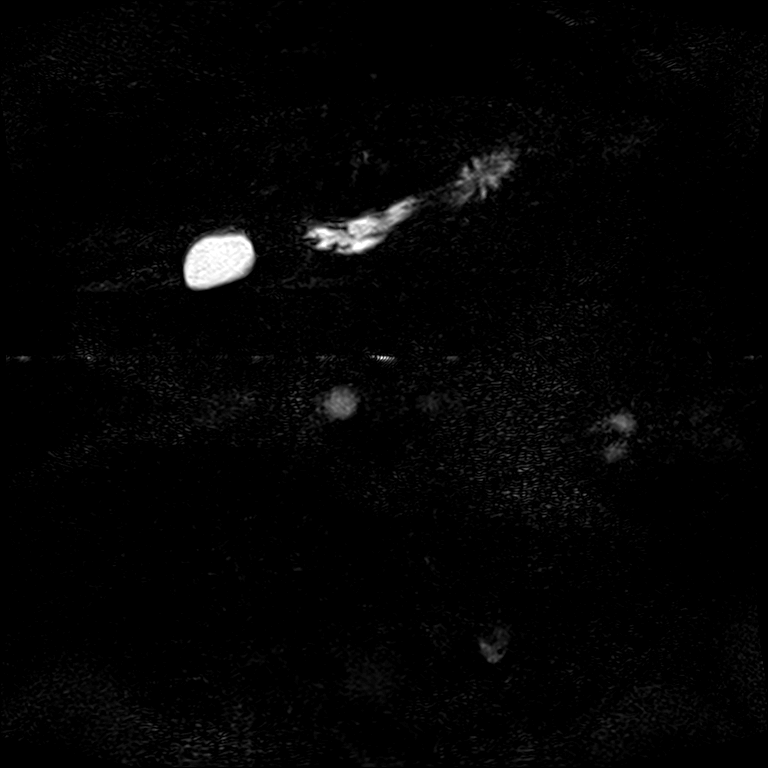

[38 of 48 positions shown; findings below may reference images not displayed]

FINDINGS: Lower chest:  The visualized lower chest appears unremarkable.

Hepatobiliary: The liver demonstrates normal contours and no
significant steatosis. 1.7 cm T2 hyperintense lesion anteriorly in
the dome of the right hepatic lobe has mildly enlarged, but is
likely a cyst based on prior ultrasound. The other lesion more
posteriorly in the dome of the right hepatic lobe is stable,
measuring 2.1 cm. No suspicious hepatic findings. No evidence of
gallstones, gallbladder wall thickening or biliary dilatation.

Pancreas: Tiny cystic lesions within the pancreas are unchanged,
largest measuring 7 mm in the uncinate process (image [DATE]). No
suspicious pancreatic lesion, ductal dilatation or surrounding
inflammation on noncontrast imaging.

Spleen: Normal in size without focal abnormality.

Adrenals/Urinary Tract: Both adrenal glands appear normal. Both
kidneys appear normal, without hydronephrosis.

Stomach/Bowel: The stomach appears unremarkable for its degree of
distension. No evidence of bowel wall thickening, distention or
surrounding inflammatory change.

Vascular/Lymphatic: There are no enlarged abdominal lymph nodes. No
significant vascular findings.

Other: No ascites.

Musculoskeletal: No acute or significant osseous findings.
IMPRESSION: 1. No acute findings or explanation for the patient's symptoms.
2. Stable tiny cystic lesions within the pancreas, largest measuring
7 mm in the uncinate process. These remain nonspecific but
demonstrate no worrisome features. Consider continued follow-up at
1-2 year intervals per consensus guidelines. This recommendation
follows ACR consensus guidelines: Management of Incidental
Pancreatic Cysts: A White Paper of the ACR Incidental Findings
Committee. [HOSPITAL] [id number];[DATE].
3. Mild enlargement of a small hepatic cyst anteriorly in the dome
of the right hepatic lobe.

## 2021-09-27 ENCOUNTER — Other Ambulatory Visit: Payer: Self-pay

## 2021-09-27 ENCOUNTER — Emergency Department (HOSPITAL_COMMUNITY)
Admission: EM | Admit: 2021-09-27 | Discharge: 2021-09-28 | Disposition: A | Discharge status: Home / Self Care | Payer: 59 | Attending: Emergency Medicine | Admitting: Emergency Medicine

## 2021-09-27 DIAGNOSIS — Z79899 Other long term (current) drug therapy: Secondary | ICD-10-CM | POA: Insufficient documentation

## 2021-09-27 DIAGNOSIS — M79602 Pain in left arm: Secondary | ICD-10-CM | POA: Insufficient documentation

## 2021-09-27 DIAGNOSIS — R0602 Shortness of breath: Secondary | ICD-10-CM | POA: Insufficient documentation

## 2021-09-27 DIAGNOSIS — I10 Essential (primary) hypertension: Secondary | ICD-10-CM | POA: Diagnosis not present

## 2021-09-27 NOTE — ED Provider Notes (Signed)
Emergency Medicine Provider Triage Evaluation Note  Christina Delacruz , a 45 y.o. female  was evaluated in triage.  Pt complains of pain in the left shoulder/neck area which radiates down her left arm at times with tightness in her chest and shortness of breath.  Reports episode this morning lasting about 2 hours while she was having and then another episode this evening while resting in bed which prompted her to come to the ER tonight.. Patient is on metoprolol for racing heart, reports taking it at this time, no missed doses.  Reports her doctor noticed an abnormality in her EKG and referred her to cardiology which has not been seen yet. Review of Systems  Positive: Chest pain, shortness of breath, neck/arm pain Negative: Diaphoresis, nausea, vomiting  Physical Exam  BP 118/85 (BP Location: Left Arm)   Pulse 95   Temp 98.1 F (36.7 C) (Oral)   Resp 18   LMP 10/09/2019   SpO2 97%  Gen:   Awake, no distress   Resp:  Normal effort  MSK:   Moves extremities without difficulty  Other:  No pain with palpation left trapezius range of motion left arm.  Heart regular rate and rhythm.  Medical Decision Making  Medically screening exam initiated at 11:57 PM.  Appropriate orders placed.  Christina Delacruz was informed that the remainder of the evaluation will be completed by another provider, this initial triage assessment does not replace that evaluation, and the importance of remaining in the ED until their evaluation is complete.     Jeannie Fend, PA-C 09/28/21 0001    Sabas Sous, MD 09/28/21 [phone]

## 2021-09-27 NOTE — ED Triage Notes (Addendum)
Pt arrives POV for eval of 2 episodes of dizziness, palpitations, chest pain w/ radiation down L arm. One episode onset this AM, another this evening. Self limiting. Pt currently on metoprolol d/t hx of tachycardia

## 2021-09-28 ENCOUNTER — Encounter (HOSPITAL_COMMUNITY): Payer: Self-pay

## 2021-09-28 ENCOUNTER — Emergency Department (HOSPITAL_COMMUNITY): Payer: 59

## 2021-09-28 LAB — CBC
HCT: 41.1 % (ref 36.0–46.0)
Hemoglobin: 13.8 g/dL (ref 12.0–15.0)
MCH: 31.7 pg (ref 26.0–34.0)
MCHC: 33.6 g/dL (ref 30.0–36.0)
MCV: 94.3 fL (ref 80.0–100.0)
Platelets: 277 10*3/uL (ref 150–400)
RBC: 4.36 MIL/uL (ref 3.87–5.11)
RDW: 11.6 % (ref 11.5–15.5)
WBC: 7 10*3/uL (ref 4.0–10.5)
nRBC: 0 % (ref 0.0–0.2)

## 2021-09-28 LAB — TROPONIN I (HIGH SENSITIVITY)
Troponin I (High Sensitivity): 3 ng/L (ref <18)
Troponin I (High Sensitivity): 3 ng/L (ref <18)

## 2021-09-28 LAB — BASIC METABOLIC PANEL
Anion gap: 9 (ref 5–15)
BUN: 10 mg/dL (ref 6–20)
CO2: 26 mmol/L (ref 22–32)
Calcium: 8.9 mg/dL (ref 8.9–10.3)
Chloride: 99 mmol/L (ref 98–111)
Creatinine, Ser: 1.09 mg/dL — ABNORMAL HIGH (ref 0.44–1.00)
GFR, Estimated: >60 mL/min (ref >60)
Glucose, Bld: 99 mg/dL (ref 70–99)
Potassium: 3.1 mmol/L — ABNORMAL LOW (ref 3.5–5.1)
Sodium: 134 mmol/L — ABNORMAL LOW (ref 135–145)

## 2021-09-28 MED ORDER — POTASSIUM CHLORIDE CRYS ER 20 MEQ PO TBCR
40.0000 meq | EXTENDED_RELEASE_TABLET | Freq: Once | ORAL | Status: AC
  Administered 2021-09-28: 40 meq via ORAL
  Filled 2021-09-28: qty 2

## 2021-09-28 NOTE — ED Provider Notes (Signed)
[facility] EMERGENCY DEPARTMENT Provider Note   CSN: [id number] Arrival date & time: 09/27/21  2333     History Chief Complaint  Patient presents with   Dizziness   Chest Pain    Christina Delacruz is a 45 y.o. female.  HPI 45 year old female presents with left arm pain. Started yesterday morning, and has been on and off a few times. Max was about 1 hour in length.  Nothing obviously causes it.  Feels like her whole arm gets sore and painful.  Right now it is okay.  No neck pain.  No chest pain though she has felt some fluttering/abnormal sensation that is similar to palpitations in the past.  Some shortness of breath.  No leg swelling.  No left arm swelling.  No weakness or numbness in the extremity.  Past Medical History:  Diagnosis Date   Hypertension     There are no problems to display for this patient.   History reviewed. No pertinent surgical history.   OB History     Gravida      Para      Term      Preterm      AB      Living  0      SAB      IAB      Ectopic      Multiple      Live Births              History reviewed. No pertinent family history.  Social History   Tobacco Use   Smoking status: Never   Smokeless tobacco: Never  Vaping Use   Vaping Use: Never used  Substance Use Topics   Alcohol use: No   Drug use: No    Home Medications Prior to Admission medications   Medication Sig Start Date End Date Taking? Authorizing Provider  buPROPion (WELLBUTRIN XL) 300 MG 24 hr tablet Take 300 mg by mouth daily as needed. 09/13/21   [provider]  hydrochlorothiazide (HYDRODIURIL) 25 MG tablet Take 25 mg by mouth daily. 09/13/21   [provider]  losartan (COZAAR) 25 MG tablet Take 25 mg by mouth every morning. 07/27/21   [provider]  metoprolol succinate (TOPROL-XL) 25 MG 24 hr tablet Take 25 mg by mouth daily. 07/27/21   [provider]  oxyCODONE-acetaminophen  (PERCOCET/ROXICET) 5-325 MG tablet Take 2 tablets by mouth every 4 (four) hours as needed for severe pain. 07/27/18   Sabas Sous, MD  OZEMPIC, 1 MG/DOSE, 4 MG/3ML SOPN Inject into the skin. 06/28/21   [provider]    Allergies    Penicillins  Review of Systems   Review of Systems  Respiratory:  Positive for shortness of breath.   Cardiovascular:  Positive for palpitations. Negative for chest pain and leg swelling.  Musculoskeletal:  Positive for myalgias.  Neurological:  Negative for weakness.  All other systems reviewed and are negative.  Physical Exam Updated Vital Signs BP (!) 113/91 (BP Location: Right Arm)   Pulse 86   Temp 97.7 F (36.5 C) (Oral)   Resp 20   Ht 5\' 5"  (1.651 m)   Wt 105 kg   LMP 09/13/2019 (Approximate)   SpO2 100%   BMI 38.52 kg/m   Physical Exam Vitals and nursing note reviewed.  Constitutional:      Appearance: She is well-developed.  HENT:     Head: Normocephalic and atraumatic.     Right  Ear: External ear normal.     Left Ear: External ear normal.     Nose: Nose normal.  Eyes:     General:        Right eye: No discharge.        Left eye: No discharge.  Cardiovascular:     Rate and Rhythm: Normal rate and regular rhythm.     Pulses:          Radial pulses are 2+ on the left side.     Heart sounds: Normal heart sounds.  Pulmonary:     Effort: Pulmonary effort is normal.     Breath sounds: Normal breath sounds.  Abdominal:     Palpations: Abdomen is soft.     Tenderness: There is no abdominal tenderness.  Musculoskeletal:     Left shoulder: No tenderness. Normal range of motion.     Left upper arm: Tenderness present. No swelling.     Left forearm: Tenderness (mild) present.     Right lower leg: No tenderness. No edema.     Left lower leg: No tenderness. No edema.     Comments: She does have some hard to localize diffuse tenderness of her left upper arm without obvious swelling.  Some tenderness in her forearm but is  worse in the upper arm. Normal strength and sensation in LUE Mild trapezius tenderness  Skin:    General: Skin is warm and dry.  Neurological:     Mental Status: She is alert.  Psychiatric:        Mood and Affect: Mood is not anxious.    ED Results / Procedures / Treatments   Labs (all labs ordered are listed, but only abnormal results are displayed) Labs Reviewed  BASIC METABOLIC PANEL - Abnormal; Notable for the following components:      Result Value   Sodium 134 (*)    Potassium 3.1 (*)    Creatinine, Ser 1.09 (*)    All other components within normal limits  CBC  TROPONIN I (HIGH SENSITIVITY)  TROPONIN I (HIGH SENSITIVITY)    EKG EKG Interpretation  Date/Time:  Wednesday September 27 2021 23:56:26 EST Ventricular Rate:  92 PR Interval:  176 QRS Duration: 82 QT Interval:  362 QTC Calculation: 447 R Axis:   80 Text Interpretation: Normal sinus rhythm no acute ST/T changes similar to Dec 2020 Confirmed by Pricilla Loveless [phone]) on 09/28/2021 8:37:13 AM  Radiology DG Chest Port 1 View  Result Date: 09/28/2021 CLINICAL DATA:  Chest pain EXAM: PORTABLE CHEST 1 VIEW COMPARISON:  10/16/2019 FINDINGS: The heart size and mediastinal contours are within normal limits. Both lungs are clear. The visualized skeletal structures are unremarkable. IMPRESSION: No active disease. Electronically Signed   By: Helyn Numbers M.D.   On: 09/28/2021 00:39    Procedures Procedures   Medications Ordered in ED Medications  potassium chloride SA (KLOR-CON M) CR tablet 40 mEq (has no administration in time range)    ED Course  I have reviewed the triage vital signs and the nursing notes.  Pertinent labs & imaging results that were available during my care of the patient were reviewed by me and considered in my medical decision making (see chart for details).    MDM Rules/Calculators/A&P                           Patient was worked up for ACS when initially arrived many hours ago.   Troponins are  negative x2.  ECG is without obvious ischemia.  Neurovascular intact in the left upper extremity and I do not appreciate any significant swelling and she does not either.  I have low suspicion for PE or venous thromboembolism.  She does have some soreness in her arm and perhaps this is a muscular component.  There is no neurodeficit.  At this point I think she can follow-up with her PCP and return if any symptoms worsen or recur.  We will replete her potassium which is mildly low.  Offered ibuprofen but she declines and would like to take it at home. Final Clinical Impression(s) / ED Diagnoses Final diagnoses:  Left arm pain    Rx / DC Orders ED Discharge Orders     None        Pricilla Loveless, MD 09/28/21 [phone]

## 2021-09-28 NOTE — Discharge Instructions (Signed)
If you develop recurrent, continued, or worsening chest pain, shortness of breath, fever, vomiting, abdominal or back pain, or any other new/concerning symptoms then return to the ER for evaluation.   Take ibuprofen and/or Tylenol for your left arm discomfort.

## 2022-02-08 NOTE — Progress Notes (Signed)
?Cardiology Office Note:   ? ?Date:  02/12/2022  ? ?ID:  Christina Delacruz, DOB 09/27/1976, MRN [id number] ? ?PCP:  Teodora Medici, FNP  ?Cardiologist:  None   ? ?Referring MD: Teodora Medici, FNP  ? ?Chief Complaint  ?Patient presents with  ? Palpitations  ? ? ?History of Present Illness:   ? ?Christina Delacruz is a 46 y.o. female with a hx of hypertension, pre-diabetes, and depression here today for the evaluation of palpitations. She was her PCP 12/2021 and reported years of palpitations. She also reported an abnormal EKG and family history of early CAD so she requested a referral to cardiology.  ? ?Today, she is doing well. Her palpitations have improved with metoprolol and occur once or twice a month for 5 minutes. She has episodes of heart racing and chest pain at night that worried her because it felt like her heart would stop. However, these episodes only occur at night regardless of activity. Her blood pressure at these times were high. She had episodes of low blood pressure after taking her old medications. Her symptoms occur primarily at night. She endorses swilling primarily in her bilateral hands. She feels her whole body swells at night. She sleeps about 5 to 6 hours at night. She is able to perform chair exercises because the arthritis in her bilateral knees and lower back limit her activity. She tries to avoid caffeine and added sugar. For diet, she enjoys fried foods but is trying to improve her diet because of her pre-diabetes. Her maternal grandparents, uncle, and father had MI events in their 43s and underwent open heart surgery. Her father also had several strokes. Her great aunt had congestive heart failure. She would prefer to work on diet and exercise.  She denies any shortness of breath, acid reflux, lightheadedness, headaches, syncope, orthopnea, or PND.  ? ?Past Medical History:  ?Diagnosis Date  ? Essential hypertension 02/12/2022  ? Exertional dyspnea 02/12/2022  ? Hypertension   ?  Morbid obesity ([place]) 02/12/2022  ? Palpitations 02/12/2022  ? Pre-diabetes 02/12/2022  ? ? ?History reviewed. No pertinent surgical history. ? ?Current Medications: ?Current Meds  ?Medication Sig  ? buPROPion (WELLBUTRIN) 75 MG tablet Take 75 mg by mouth every other day.  ? hydrochlorothiazide (HYDRODIURIL) 25 MG tablet Take 25 mg by mouth daily.  ? losartan (COZAAR) 25 MG tablet Take 25 mg by mouth every morning.  ? metoprolol succinate (TOPROL-XL) 25 MG 24 hr tablet Take 25 mg by mouth daily.  ? oxyCODONE-acetaminophen (PERCOCET/ROXICET) 5-325 MG tablet Take 2 tablets by mouth every 4 (four) hours as needed for severe pain.  ?  ? ?Allergies:   Penicillins  ? ?Social History  ? ?Socioeconomic History  ? Marital status: Married  ?  Spouse name: Not on file  ? Number of children: Not on file  ? Years of education: Not on file  ? Highest education level: Not on file  ?Occupational History  ? Not on file  ?Tobacco Use  ? Smoking status: Never  ? Smokeless tobacco: Never  ?Vaping Use  ? Vaping Use: Never used  ?Substance and Sexual Activity  ? Alcohol use: No  ? Drug use: No  ? Sexual activity: Yes  ?  Birth control/protection: None  ?Other Topics Concern  ? Not on file  ?Social History Narrative  ? Not on file  ? ?Social Determinants of Health  ? ?Financial Resource Strain: Low Risk   ? Difficulty of Paying Living Expenses: Not hard at  all  ?Food Insecurity: No Food Insecurity  ? Worried About Charity fundraiser in the Last Year: Never true  ? Ran Out of Food in the Last Year: Never true  ?Transportation Needs: No Transportation Needs  ? Lack of Transportation (Medical): No  ? Lack of Transportation (Non-Medical): No  ?Physical Activity: Inactive  ? Days of Exercise per Week: 0 days  ? Minutes of Exercise per Session: 0 min  ?Stress: Not on file  ?Social Connections: Not on file  ?  ? ?Family History: ?The patient's family history includes Heart attack in her father and maternal uncle; Heart attack (age of onset: 31) in  her maternal grandfather; Heart attack (age of onset: 40) in her maternal grandmother; Heart failure in her maternal aunt; Hypertension in her brother, mother, and sister; Stroke in her father. ? ?ROS:   ?Please see the history of present illness.    ?(+) Palpitations ?(+) Chest pain ?(+) Bilateral knee pain ?(+) Edema (primarily bilateral hands) ?(+) Back pain ?All other systems reviewed and negative.  ? ?EKGs/Labs/Other Studies Reviewed:   ? ?The following studies were reviewed today: ?No prior cardiovascular studies ? ?EKG:  ?02/12/22: Sinus rhythm rate 76 ? ?Recent Labs: ?09/28/2021: BUN 10; Creatinine, Ser 1.09; Hemoglobin 13.8; Platelets 277; Potassium 3.1; Sodium 134  ? ?Recent Lipid Panel ?No results found for: CHOL, TRIG, HDL, CHOLHDL, VLDL, LDLCALC, LDLDIRECT ? ?CHA2DS2-VASc Score =   [ ] .  Therefore, the patient's annual risk of stroke is   %.    ?  ? ? ?Physical Exam:   ? ?VS:  BP 110/82 (BP Location: Right Arm, Patient Position: Sitting, Cuff Size: Large)   Pulse 76   Ht 5\' 5"  (1.651 m)   Wt 238 lb 9.6 oz (108.2 kg)   BMI 39.71 kg/m?  , BMI Body mass index is 39.71 kg/m?. ?GENERAL:  Well appearing ?HEENT: Pupils equal round and reactive, fundi not visualized, oral mucosa unremarkable ?NECK:  No jugular venous distention, waveform within normal limits, carotid upstroke brisk and symmetric, no bruits, no thyromegaly ?LUNGS:  Clear to auscultation bilaterally ?HEART:  RRR.  PMI not displaced or sustained,S1 and S2 within normal limits, no S3, no S4, no clicks, no rubs, no murmurs ?ABD:  Flat, positive bowel sounds normal in frequency in pitch, no bruits, no rebound, no guarding, no midline pulsatile mass, no hepatomegaly, no splenomegaly ?EXT:  2 plus pulses throughout, no edema, no cyanosis no clubbing ?SKIN:  No rashes no nodules ?NEURO:  Cranial nerves II through XII grossly intact, motor grossly intact throughout ?PSYCH:  Cognitively intact, oriented to person place and time ? ?ASSESSMENT:   ? ?1.  Palpitations   ?2. Essential hypertension   ?3. Pre-diabetes   ?4. Morbid obesity ([place])   ?5. Exertional dyspnea   ? ?PLAN:   ? ?Palpitations ?Episodes seem to occur mostly at night.  However she also has tachycardia during the day when she is feeling stressed at work. She notes that she does have a lot of stress that could be contributing.  Lately its been occurring about once a month.  Given that her episodes are infrequent and overall seem to be better on metoprolol, we will not get an ambulatory monitor at this time.  Labs were unremarkable with her PCP, and excluding thyroid function.  Continue metoprolol.  I suspect it may be related to GERD.  She does report having symptoms of acid reflux.  We discussed not eating heavy meals for at least 2  hours before going to bed and elevating the head of her bed.  We also discussed limiting red sauces, spicy foods, and peppermint.  If these things do not improve, we will consider a monitor and a trial of a PPI at follow-up. ? ?Essential hypertension ?Blood pressure is well-controlled.  She is going to work on cooking more at home and limiting sodium intake.  Continue HCTZ, metoprolol, and losartan.  Her goal is to ultimately get off as many medications as possible.  She will focus on diet and exercise to give her the best chance for reducing medications. ? ?Pre-diabetes ?She was started on Ozempic but has been unable to get up from the pharmacy for a while.  She think she wants to work on diet and exercise and not resume it at this time. ? 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  She would like to become more active again.  We will refer her to the PREP program to the [facility].  We will also refer her to a nutritionist.  She is going to work on trying to exercise more at home.  Recommend getting at least 150 minutes of exercise weekly. ? ?Exertional dyspnea ?Ms. Lana she has exertional dyspnea and chest  pressure.  She also has a family history of premature CAD.  She is unable to walk on a treadmill.  Therefore we will get a coronary CTA to better understand her cardiovascular risk.  She will take metoprolol tart

## 2022-02-12 ENCOUNTER — Ambulatory Visit (HOSPITAL_BASED_OUTPATIENT_CLINIC_OR_DEPARTMENT_OTHER): Payer: 59 | Admitting: Cardiovascular Disease

## 2022-02-12 ENCOUNTER — Encounter (HOSPITAL_BASED_OUTPATIENT_CLINIC_OR_DEPARTMENT_OTHER): Payer: Self-pay | Admitting: Cardiovascular Disease

## 2022-02-12 DIAGNOSIS — I10 Essential (primary) hypertension: Secondary | ICD-10-CM

## 2022-02-12 DIAGNOSIS — R7303 Prediabetes: Secondary | ICD-10-CM

## 2022-02-12 DIAGNOSIS — R002 Palpitations: Secondary | ICD-10-CM

## 2022-02-12 DIAGNOSIS — R0609 Other forms of dyspnea: Secondary | ICD-10-CM

## 2022-02-12 HISTORY — DX: Prediabetes: R73.03

## 2022-02-12 HISTORY — DX: Palpitations: R00.2

## 2022-02-12 HISTORY — DX: Essential (primary) hypertension: I10

## 2022-02-12 HISTORY — DX: Morbid (severe) obesity due to excess calories: E66.01

## 2022-02-12 HISTORY — DX: Other forms of dyspnea: R06.09

## 2022-02-12 MED ORDER — METOPROLOL TARTRATE 100 MG PO TABS: ORAL_TABLET | ORAL | 0 refills | Status: AC

## 2022-02-12 NOTE — Patient Instructions (Signed)
Medication Instructions:  ?TAKE METOPROLOL TARTRATE 100  MG 2 HOURS PRIOR TO YOUR CARDIAC CT ?DO NOT TAKE YOUR METOPROLOL SUCC THAT  MORNING ? ?*If you need a refill on your cardiac medications before your next appointment, please call your pharmacy* ? ?Lab Work: ?BMET 1 WEEK PRIOR TO CARDIAC CT  ? ?If you have labs (blood work) drawn today and your tests are completely normal, you will receive your results only by: ?MyChart Message (if you have MyChart) OR ?A paper copy in the mail ?If you have any lab test that is abnormal or we need to change your treatment, we will call you to review the results. ? ?Testing/Procedures: ?Your physician has requested that you have cardiac CT. Cardiac computed tomography (CT) is a painless test that uses an x-ray machine to take clear, detailed pictures of your heart. For further information please visit [email or web address] Please follow instruction sheet as given. ? ?Follow-Up: ?At [facility], you and your health needs are our priority.  As part of our continuing mission to provide you with exceptional heart care, we have created designated Provider Care Teams.  These Care Teams include your primary Cardiologist (physician) and Advanced Practice Providers (APPs -  Physician Assistants and Nurse Practitioners) who all work together to provide you with the care you need, when you need it. ? ?We recommend signing up for the patient portal called "MyChart".  Sign up information is provided on this After Visit Summary.  MyChart is used to connect with patients for Virtual Visits (Telemedicine).  Patients are able to view lab/test results, encounter notes, upcoming appointments, etc.  Non-urgent messages can be sent to your provider as well.   ?To learn more about what you can do with MyChart, go to [email or web address].   ? ?Your next appointment:   ?6 month(s) ? ?The format for your next appointment:   ?In Person ? ?Provider:   ?Chilton Si, MD  ? ?CAITLIN W NP IN 1 MONTH   ? ?Other Instructions ?YOU HAVE BEEN REFERRED TO NUTRITION AND PREP ([facility]) PROGRAM. IF YOU DO NOT HEAR FROM THEM IN 2 WEEKS CALL THE OFFICE TO FOLLOW UP  ? ? ? ?Your cardiac CT will be scheduled at one of the below locations:  ? ?[facility] ?[address] ?[place], [place] [address] ?[phone] ? ?OR ? ?[facility] ?[address] ?[address] ?[place], [place] [address] ?[phone] ?If scheduled at [facility], please arrive at the [facility] Entrance (Entrance C2) of [facility] 30 minutes prior to test start time. ?You can use the FREE valet parking offered at entrance C (encouraged to control the heart rate for the test)  ?Proceed to the [facility] Radiology Department (first floor) to check-in and test prep. ? ?All radiology patients and guests should use entrance C2 at [facility], accessed from [facility], even though the hospital's physical address listed is [address]. ? ? ? ?If scheduled at [facility], please arrive 15 mins early for check-in and test prep. ? ?Please follow these instructions carefully (unless otherwise directed): ? ?Hold all erectile dysfunction medications at least 3 days (72 hrs) prior to test. ? ?On the Night Before the Test: ?Be sure to Drink plenty of water. ?Do not consume any caffeinated/decaffeinated beverages or chocolate 12 hours prior to your test. ?Do not take any antihistamines 12 hours prior to your test. ?If the patient has contrast allergy: ?Patient  will need a prescription for Prednisone and very clear instructions (as follows): ?Prednisone 50 mg - take 13 hours prior to test ?Take another Prednisone 50 mg 7 hours prior to test ?Take another Prednisone 50 mg 1 hour prior to test ?Take Benadryl 50 mg 1 hour prior to test ?Patient must complete all four doses of above prophylactic medications. ?Patient will need a ride after test due  to Benadryl. ? ?On the Day of the Test: ?Drink plenty of water until 1 hour prior to the test. ?Do not eat any food 4 hours prior to the test. ?You may take your regular medications prior to the test.  ?Take metoprolol (Lopressor) two hours prior to test. ?HOLD Furosemide/Hydrochlorothiazide morning of the test. ?FEMALES- please wear underwire-free bra if available, avoid dresses & tight clothing ? ?After the Test: ?Drink plenty of water. ?After receiving IV contrast, you may experience a mild flushed feeling. This is normal. ?On occasion, you may experience a mild rash up to 24 hours after the test. This is not dangerous. If this occurs, you can take Benadryl 25 mg and increase your fluid intake. ?If you experience trouble breathing, this can be serious. If it is severe call 911 IMMEDIATELY. If it is mild, please call our office. ?If you take any of these medications: Glipizide/Metformin, Avandament, Glucavance, please do not take 48 hours after completing test unless otherwise instructed. ? ?We will call to schedule your test 2-4 weeks out understanding that some insurance companies will need an authorization prior to the service being performed.  ? ?For non-scheduling related questions, please contact the cardiac imaging nurse navigator should you have any questions/concerns: ?Rockwell Alexandria, Cardiac Imaging Nurse Navigator ?Larey Brick, Cardiac Imaging Nurse Navigator ?[facility] ?Direct Office Dial: [phone]  ? ?For scheduling needs, including cancellations and rescheduling, please call [place], [phone]. ? ?Cardiac CT Angiogram ?A cardiac CT angiogram is a procedure to look at the heart and the area around the heart. It may be done to help find the cause of chest pains or other symptoms of heart disease. During this procedure, a substance called contrast dye is injected into the blood vessels in the area to be checked. A large X-ray machine, called a CT scanner, then takes  detailed pictures of the heart and the surrounding area. The procedure is also sometimes called a coronary CT angiogram, coronary artery scanning, or CTA. ?A cardiac CT angiogram allows the health care provider to see how well blood is flowing to and from the heart. The health care provider will be able to see if there are any problems, such as: ?Blockage or narrowing of the coronary arteries in the heart. ?Fluid around the heart. ?Signs of weakness or disease in the muscles, valves, and tissues of the heart. ?Tell a health care provider about: ?Any allergies you have. This is especially important if you have had a previous allergic reaction to contrast dye. ?All medicines you are taking, including vitamins, herbs, eye drops, creams, and over-the-counter medicines. ?Any blood disorders you have. ?Any surgeries you have had. ?Any medical conditions you have. ?Whether you are pregnant or may be pregnant. ?Any anxiety disorders, chronic pain, or other conditions you have that may increase your stress or prevent you from lying still. ?What are the risks? ?Generally, this is a safe procedure. However, problems may occur, including: ?Bleeding. ?Infection. ?Allergic reactions to medicines or dyes. ?Damage to other structures or organs. ?Kidney damage from the contrast dye that is used. ?Increased  risk of cancer from radiation exposure. This risk is low. Talk with your health care provider about: ?The risks and benefits of testing. ?How you can receive the lowest dose of radiation. ?What happens before the procedure? ?Wear comfortable clothing and remove any jewelry, glasses, dentures, and hearing aids. ?Follow instructions from your health care provider about eating and drinking. This may include: ?For 12 hours before the procedure -- avoid caffeine. This includes tea, coffee, soda, energy drinks, and diet pills. Drink plenty of water or other fluids that do not have caffeine in them. Being well hydrated can prevent  complications. ?For 4-6 hours before the procedure -- stop eating and drinking. The contrast dye can cause nausea, but this is less likely if your stomach is empty. ?Ask your health care provider about c

## 2022-02-12 NOTE — Assessment & Plan Note (Addendum)
Episodes seem to occur mostly at night.  However she also has tachycardia during the day when she is feeling stressed at work. She notes that she does have a lot of stress that could be contributing.  Lately its been occurring about once a month.  Given that her episodes are infrequent and overall seem to be better on metoprolol, we will not get an ambulatory monitor at this time.  Labs were unremarkable with her PCP, and excluding thyroid function.  Continue metoprolol.  I suspect it may be related to GERD.  She does report having symptoms of acid reflux.  We discussed not eating heavy meals for at least 2 hours before going to bed and elevating the head of her bed.  We also discussed limiting red sauces, spicy foods, and peppermint.  If these things do not improve, we will consider a monitor and a trial of a PPI at follow-up. ?

## 2022-02-12 NOTE — Assessment & Plan Note (Signed)
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  She would like to become more active again.  We will refer her to the PREP program to the [facility].  We will also refer her to a nutritionist.  She is going to work on trying to exercise more at home.  Recommend getting at least 150 minutes of exercise weekly. ?

## 2022-02-12 NOTE — Assessment & Plan Note (Signed)
Blood pressure is well-controlled.  She is going to work on cooking more at home and limiting sodium intake.  Continue HCTZ, metoprolol, and losartan.  Her goal is to ultimately get off as many medications as possible.  She will focus on diet and exercise to give her the best chance for reducing medications. ?

## 2022-02-12 NOTE — Assessment & Plan Note (Signed)
She was started on Ozempic but has been unable to get up from the pharmacy for a while.  She think she wants to work on diet and exercise and not resume it at this time. ?

## 2022-02-12 NOTE — Assessment & Plan Note (Signed)
Christina Delacruz she has exertional dyspnea and chest pressure.  She also has a family history of premature CAD.  She is unable to walk on a treadmill.  Therefore we will get a coronary CTA to better understand her cardiovascular risk.  She will take metoprolol tartrate 100 mg on the day of her CT and then resume her succinate the following day. ?

## 2022-02-15 ENCOUNTER — Telehealth: Payer: Self-pay

## 2022-02-15 NOTE — Telephone Encounter (Signed)
Called to discuss PREP program, she is driving in the rain, wants to call me back this evening or tomorrow. ?

## 2022-02-16 ENCOUNTER — Telehealth: Payer: Self-pay

## 2022-02-16 NOTE — Telephone Encounter (Signed)
Called to discuss PREP program, she prefers next [place] evening class; will have Pam, RN [facility] contact her with schedule.  ?

## 2022-02-22 ENCOUNTER — Telehealth (HOSPITAL_BASED_OUTPATIENT_CLINIC_OR_DEPARTMENT_OTHER): Payer: Self-pay | Admitting: Cardiovascular Disease

## 2022-02-22 NOTE — Telephone Encounter (Signed)
Left v/m to call back and schedule 1 mo. With Gillian Shields and the 6 mo. Appt with Dr. Duke Salvia.  ?

## 2022-03-15 ENCOUNTER — Telehealth (HOSPITAL_COMMUNITY): Payer: Self-pay | Admitting: *Deleted

## 2022-03-15 ENCOUNTER — Encounter (HOSPITAL_BASED_OUTPATIENT_CLINIC_OR_DEPARTMENT_OTHER): Payer: Self-pay | Admitting: Cardiovascular Disease

## 2022-03-15 NOTE — Telephone Encounter (Signed)
Reaching out to patient to offer assistance regarding upcoming cardiac imaging study; pt verbalizes understanding of appt date/time, parking situation and where to check in, pre-test NPO status and medications ordered, and verified current allergies; name and call back number provided for further questions should they arise  Larey Brick RN Navigator Cardiac Imaging [facility] [phone] office [phone] cell  Patient to hold her daily BP medications and take 100mg  metoprolol tartrate two hours prior to her cardiac CT scan. She is aware to arrive at 4pm.

## 2022-03-16 ENCOUNTER — Encounter (HOSPITAL_COMMUNITY): Payer: Self-pay

## 2022-03-16 ENCOUNTER — Ambulatory Visit (HOSPITAL_COMMUNITY): Admission: RE | Admit: 2022-03-16 | Payer: 59 | Source: Ambulatory Visit

## 2022-03-27 ENCOUNTER — Telehealth: Payer: Self-pay

## 2022-03-27 NOTE — Telephone Encounter (Signed)
LVMT pt requesting call back to discuss next PREP class at [facility] starting on 04/17/22

## 2022-04-06 ENCOUNTER — Telehealth (HOSPITAL_COMMUNITY): Payer: Self-pay | Admitting: *Deleted

## 2022-04-06 NOTE — Telephone Encounter (Signed)
Reaching out to patient to offer assistance regarding upcoming cardiac imaging study; pt verbalizes understanding of appt date/time, parking situation and where to check in, pre-test NPO status and medications ordered; name and call back number provided for further questions should they arise  Larey Brick RN Navigator Cardiac Imaging [facility] [phone] office [phone] cell  Patient to hold her daily BP medications and take 100mg  metoprolol tartrate two hours prior to her cardiac CT scan.  She is aware to arrive at 8am.

## 2022-04-09 ENCOUNTER — Encounter (HOSPITAL_COMMUNITY): Payer: Self-pay

## 2022-04-09 ENCOUNTER — Ambulatory Visit (HOSPITAL_COMMUNITY)
Admission: RE | Admit: 2022-04-09 | Discharge: 2022-04-09 | Disposition: A | Discharge status: Home / Self Care | Payer: 59 | Source: Ambulatory Visit | Attending: Cardiovascular Disease | Admitting: Cardiovascular Disease

## 2022-04-09 DIAGNOSIS — I10 Essential (primary) hypertension: Secondary | ICD-10-CM | POA: Diagnosis not present

## 2022-04-09 DIAGNOSIS — R002 Palpitations: Secondary | ICD-10-CM | POA: Insufficient documentation

## 2022-04-09 DIAGNOSIS — R0609 Other forms of dyspnea: Secondary | ICD-10-CM | POA: Diagnosis not present

## 2022-04-09 MED ORDER — NITROGLYCERIN 0.4 MG SL SUBL
SUBLINGUAL_TABLET | SUBLINGUAL | Status: AC
  Filled 2022-04-09: qty 2

## 2022-04-09 MED ORDER — IOHEXOL 350 MG/ML SOLN
100.0000 mL | Freq: Once | INTRAVENOUS | Status: AC | PRN
  Administered 2022-04-09: 100 mL via INTRAVENOUS

## 2022-04-09 MED ORDER — METOPROLOL TARTRATE 5 MG/5ML IV SOLN
10.0000 mg | INTRAVENOUS | Status: DC | PRN
  Administered 2022-04-09: 10 mg via INTRAVENOUS

## 2022-04-09 MED ORDER — DILTIAZEM HCL 25 MG/5ML IV SOLN: 10.0000 mg | INTRAVENOUS | Status: DC | PRN

## 2022-04-09 MED ORDER — METOPROLOL TARTRATE 5 MG/5ML IV SOLN
INTRAVENOUS | Status: AC
  Administered 2022-04-09: 10 mg via INTRAVENOUS
  Filled 2022-04-09: qty 20

## 2022-04-09 MED ORDER — NITROGLYCERIN 0.4 MG SL SUBL
0.8000 mg | SUBLINGUAL_TABLET | Freq: Once | SUBLINGUAL | Status: AC
Start: 2022-04-09 — End: 2022-04-09
  Administered 2022-04-09: 0.8 mg via SUBLINGUAL

## 2022-04-09 MED ORDER — DILTIAZEM HCL 25 MG/5ML IV SOLN
INTRAVENOUS | Status: AC
  Administered 2022-04-09: 10 mg via INTRAVENOUS
  Filled 2022-04-09: qty 5

## 2022-04-13 ENCOUNTER — Telehealth: Payer: Self-pay | Admitting: Cardiovascular Disease

## 2022-04-13 DIAGNOSIS — I10 Essential (primary) hypertension: Secondary | ICD-10-CM

## 2022-04-13 DIAGNOSIS — E7800 Pure hypercholesterolemia, unspecified: Secondary | ICD-10-CM

## 2022-04-13 NOTE — Telephone Encounter (Signed)
Patient was returning phone call 

## 2023-07-03 ENCOUNTER — Other Ambulatory Visit: Payer: Self-pay

## 2023-07-03 ENCOUNTER — Emergency Department (HOSPITAL_COMMUNITY)
Admission: EM | Admit: 2023-07-03 | Discharge: 2023-07-03 | Disposition: A | Discharge status: Home / Self Care | Payer: 59 | Attending: Emergency Medicine | Admitting: Emergency Medicine

## 2023-07-03 ENCOUNTER — Encounter (HOSPITAL_COMMUNITY): Payer: Self-pay

## 2023-07-03 ENCOUNTER — Emergency Department (HOSPITAL_COMMUNITY): Payer: 59

## 2023-07-03 DIAGNOSIS — R7309 Other abnormal glucose: Secondary | ICD-10-CM | POA: Diagnosis not present

## 2023-07-03 DIAGNOSIS — R519 Headache, unspecified: Secondary | ICD-10-CM | POA: Insufficient documentation

## 2023-07-03 DIAGNOSIS — Z20822 Contact with and (suspected) exposure to covid-19: Secondary | ICD-10-CM | POA: Diagnosis not present

## 2023-07-03 LAB — URINALYSIS, ROUTINE W REFLEX MICROSCOPIC
Bilirubin Urine: NEGATIVE
Glucose, UA: NEGATIVE mg/dL
Hgb urine dipstick: NEGATIVE
Ketones, ur: NEGATIVE mg/dL
Leukocytes,Ua: NEGATIVE
Nitrite: NEGATIVE
Protein, ur: NEGATIVE mg/dL
Specific Gravity, Urine: 1.014 (ref 1.005–1.030)
pH: 6 (ref 5.0–8.0)

## 2023-07-03 LAB — CBC
HCT: 41.2 % (ref 36.0–46.0)
Hemoglobin: 13.5 g/dL (ref 12.0–15.0)
MCH: 31 pg (ref 26.0–34.0)
MCHC: 32.8 g/dL (ref 30.0–36.0)
MCV: 94.5 fL (ref 80.0–100.0)
Platelets: 244 10*3/uL (ref 150–400)
RBC: 4.36 MIL/uL (ref 3.87–5.11)
RDW: 11.9 % (ref 11.5–15.5)
WBC: 5.7 10*3/uL (ref 4.0–10.5)
nRBC: 0 % (ref 0.0–0.2)

## 2023-07-03 LAB — BASIC METABOLIC PANEL
Anion gap: 8 (ref 5–15)
BUN: 8 mg/dL (ref 6–20)
CO2: 25 mmol/L (ref 22–32)
Calcium: 8.7 mg/dL — ABNORMAL LOW (ref 8.9–10.3)
Chloride: 105 mmol/L (ref 98–111)
Creatinine, Ser: 0.84 mg/dL (ref 0.44–1.00)
GFR, Estimated: >60 mL/min (ref >60)
Glucose, Bld: 94 mg/dL (ref 70–99)
Potassium: 3.7 mmol/L (ref 3.5–5.1)
Sodium: 138 mmol/L (ref 135–145)

## 2023-07-03 LAB — HCG, SERUM, QUALITATIVE: Preg, Serum: NEGATIVE

## 2023-07-03 LAB — SARS CORONAVIRUS 2 BY RT PCR: SARS Coronavirus 2 by RT PCR: NEGATIVE

## 2023-07-03 LAB — CBG MONITORING, ED: Glucose-Capillary: 122 mg/dL — ABNORMAL HIGH (ref 70–99)

## 2023-07-03 MED ORDER — METOCLOPRAMIDE HCL 5 MG/ML IJ SOLN
5.0000 mg | Freq: Once | INTRAMUSCULAR | Status: AC
  Administered 2023-07-03: 5 mg via INTRAVENOUS
  Filled 2023-07-03: qty 2

## 2023-07-03 MED ORDER — SODIUM CHLORIDE 0.9 % IV BOLUS
500.0000 mL | Freq: Once | INTRAVENOUS | Status: AC
  Administered 2023-07-03: 500 mL via INTRAVENOUS

## 2023-07-03 MED ORDER — KETOROLAC TROMETHAMINE 30 MG/ML IJ SOLN
15.0000 mg | Freq: Once | INTRAMUSCULAR | Status: AC
  Administered 2023-07-03: 15 mg via INTRAVENOUS
  Filled 2023-07-03: qty 1

## 2023-07-03 MED ORDER — DIPHENHYDRAMINE HCL 50 MG/ML IJ SOLN
12.5000 mg | Freq: Once | INTRAMUSCULAR | Status: AC
  Administered 2023-07-03: 12.5 mg via INTRAVENOUS
  Filled 2023-07-03: qty 1

## 2023-07-03 NOTE — ED Provider Notes (Signed)
[place] EMERGENCY DEPARTMENT AT [facility] Provider Note   CSN: [id number] Arrival date & time: 07/03/23  1232     History  Chief Complaint  Patient presents with   Headache    Christina Delacruz is a 47 y.o. female.  Who [facility] emergency department with chief complaint of headache.  Patient reports that she has had an intermittent right-sided headache for 3 weeks.  She does not have a history of headaches.  She describes the headache as dull and achy and occasionally sharp.  She has not had any vomiting.  She had some chills this morning.  She denies rash, vision change, fever, vomiting, photosensitivity or neck stiffness  HPI     Home Medications Prior to Admission medications   Medication Sig Start Date End Date Taking? Authorizing Provider  hydrochlorothiazide (HYDRODIURIL) 25 MG tablet Take 25 mg by mouth daily. 09/13/21   [provider]  losartan (COZAAR) 25 MG tablet Take 25 mg by mouth every morning. 07/27/21   [provider]  metoprolol succinate (TOPROL-XL) 25 MG 24 hr tablet Take 25 mg by mouth daily. 07/27/21   [provider]  metoprolol tartrate (LOPRESSOR) 100 MG tablet TAKE 1 TABLET 2 HOURS PRIOR TO CT 02/12/22   Chilton Si, MD  oxyCODONE-acetaminophen (PERCOCET/ROXICET) 5-325 MG tablet Take 2 tablets by mouth every 4 (four) hours as needed for severe pain. 07/27/18   Sabas Sous, MD  OZEMPIC, 1 MG/DOSE, 4 MG/3ML SOPN Inject into the skin. Patient not taking: Reported on 02/12/2022 06/28/21   [provider]      Allergies    Penicillins    Review of Systems   Review of Systems  Physical Exam Updated Vital Signs BP 120/88 (BP Location: Right Arm)   Pulse 73   Temp 98.1 F (36.7 C) (Oral)   Resp (!) 22   Ht 5\' 5"  (1.651 m)   Wt 111.1 kg   SpO2 100%   BMI 40.77 kg/m  Physical Exam Physical Exam  Constitutional: Pt is oriented to person, place, and time. Pt appears well-developed and  well-nourished. No distress.  HENT:  Head: Normocephalic and atraumatic.  Mouth/Throat: Oropharynx is clear and moist.  Eyes: Conjunctivae and EOM are normal. Pupils are equal, round, and reactive to light. No scleral icterus.  No horizontal, vertical or rotational nystagmus  Neck: Normal range of motion. Neck supple.  Full active and passive ROM without pain No midline or paraspinal tenderness No nuchal rigidity or meningeal signs  Cardiovascular: Normal rate, regular rhythm and intact distal pulses.   Pulmonary/Chest: Effort normal and breath sounds normal. No respiratory distress. Pt has no wheezes. No rales.  Abdominal: Soft. Bowel sounds are normal. There is no tenderness. There is no rebound and no guarding.  Musculoskeletal: Normal range of motion.  Lymphadenopathy:    No cervical adenopathy.  Neurological: Pt. is alert and oriented to person, place, and time. He has normal reflexes. No cranial nerve deficit.  Exhibits normal muscle tone. Coordination normal.  Mental Status:  Alert, oriented, thought content appropriate. Speech fluent without evidence of aphasia. Able to follow 2 step commands without difficulty.  Cranial Nerves:  II:  Peripheral visual fields grossly normal, pupils equal, round, reactive to light III,IV, VI: ptosis not present, extra-ocular motions intact bilaterally  V,VII: smile symmetric, facial light touch sensation equal VIII: hearing grossly normal bilaterally  IX,X: midline uvula rise  XI: bilateral shoulder shrug equal and strong XII: midline tongue extension  Motor:  5/5  in upper and lower extremities bilaterally including strong and equal grip strength and dorsiflexion/plantar flexion Sensory: Pinprick and light touch normal in all extremities.  Deep Tendon Reflexes: 2+ and symmetric  Cerebellar: normal finger-to-nose with bilateral upper extremities Gait: normal gait and balance CV: distal pulses palpable throughout   Skin: Skin is warm and dry.  No rash noted. Pt is not diaphoretic.  Psychiatric: Pt has a normal mood and affect. Behavior is normal. Judgment and thought content normal.  Nursing note and vitals reviewed.  ED Results / Procedures / Treatments   Labs (all labs ordered are listed, but only abnormal results are displayed) Labs Reviewed  BASIC METABOLIC PANEL - Abnormal; Notable for the following components:      Result Value   Calcium 8.7 (*)    All other components within normal limits  CBG MONITORING, ED - Abnormal; Notable for the following components:   Glucose-Capillary 122 (*)    All other components within normal limits  SARS CORONAVIRUS 2 BY RT PCR  CBC  URINALYSIS, ROUTINE W REFLEX MICROSCOPIC  HCG, SERUM, QUALITATIVE    EKG None  Radiology No results found.  Procedures Procedures    Medications Ordered in ED Medications  sodium chloride 0.9 % bolus 500 mL (has no administration in time range)  metoCLOPramide (REGLAN) injection 5 mg (has no administration in time range)  diphenhydrAMINE (BENADRYL) injection 12.5 mg (has no administration in time range)  ketorolac (TORADOL) 30 MG/ML injection 15 mg (has no administration in time range)    ED Course/ Medical Decision Making/ A&P Clinical Course as of 07/03/23 1935  Wed Jul 03, 2023  1934 SARS Coronavirus 2 by RT PCR (hospital order, performed in [facility] hospital lab) *cepheid single result test* Anterior Nasal Swab [AH]  1934 Urinalysis, Routine w reflex microscopic -Urine, Clean Catch [AH]  1934 CBG monitoring, ED(!) [AH]  1934 Basic metabolic panel(!) [AH]  1934 CBC [AH]  1934 hCG, serum, qualitative [AH]  1934 CT Head Wo Contrast I personally visualized and interpreted the images using our PACS system. Acute findings include:  None   [AH]    Clinical Course User Index [AH] Arthor Captain, PA-C                                 Medical Decision Making Pt HA treated and improved while in ED.  Presentation is like pts typical  HA and non concerning for [facility], ICH, Meningitis, or temporal arteritis. Pt is afebrile with no focal neuro deficits, nuchal rigidity, or change in vision. Pt is to follow up with PCP to discuss prophylactic medication. Pt verbalizes understanding and is agreeable with plan to dc.    Amount and/or Complexity of Data Reviewed Labs: ordered. Decision-making details documented in ED Course. Radiology: ordered and independent interpretation performed. Decision-making details documented in ED Course. Discussion of management or test interpretation with external provider(s): Headache resolved   Risk Prescription drug management.           Final Clinical Impression(s) / ED Diagnoses Final diagnoses:  None    Rx / DC Orders ED Discharge Orders     None         Arthor Captain, PA-C 07/03/23 Arlean Hopping, MD 07/05/23 [phone]

## 2023-07-03 NOTE — ED Triage Notes (Addendum)
Pt arrives via POV. Reports intermittent headache for the past 3 weeks. Reports chills this morning. Reports episode of dizziness earlier today that has resolved.  Denies any other symptoms. Pt AxOx4.

## 2023-07-03 NOTE — ED Notes (Signed)
Patient verbalizes understanding of discharge instructions. Opportunity for questioning and answers were provided. Armband removed by staff, pt discharged from ED. Pt ambulatory to ED waiting room with steady gait.  

## 2023-07-03 NOTE — Discharge Instructions (Signed)
